# Patient Record
Sex: Male | Born: 1980 | Race: Black or African American | Hispanic: No | Marital: Single | Smoking: Never smoker
Health system: Southern US, Community
[De-identification: ages and names within clinical notes are randomized; demographics above are authoritative.]

## PROBLEM LIST (undated history)

## (undated) DIAGNOSIS — W3400XA Accidental discharge from unspecified firearms or gun, initial encounter: Secondary | ICD-10-CM

## (undated) HISTORY — PX: CHEST TUBE INSERTION: SHX231

---

## 2013-11-01 ENCOUNTER — Emergency Department (INDEPENDENT_AMBULATORY_CARE_PROVIDER_SITE_OTHER)
Admission: EM | Admit: 2013-11-01 | Discharge: 2013-11-01 | Disposition: A | Discharge status: Home / Self Care | Payer: Self-pay | Source: Home / Self Care | Attending: Emergency Medicine | Admitting: Emergency Medicine

## 2013-11-01 ENCOUNTER — Encounter (HOSPITAL_COMMUNITY): Payer: Self-pay | Admitting: Emergency Medicine

## 2013-11-01 DIAGNOSIS — S139XXA Sprain of joints and ligaments of unspecified parts of neck, initial encounter: Secondary | ICD-10-CM

## 2013-11-01 DIAGNOSIS — S335XXA Sprain of ligaments of lumbar spine, initial encounter: Secondary | ICD-10-CM

## 2013-11-01 DIAGNOSIS — Y92410 Unspecified street and highway as the place of occurrence of the external cause: Secondary | ICD-10-CM

## 2013-11-01 DIAGNOSIS — S161XXA Strain of muscle, fascia and tendon at neck level, initial encounter: Secondary | ICD-10-CM

## 2013-11-01 DIAGNOSIS — R0789 Other chest pain: Secondary | ICD-10-CM

## 2013-11-01 DIAGNOSIS — S39012A Strain of muscle, fascia and tendon of lower back, initial encounter: Secondary | ICD-10-CM

## 2013-11-01 MED ORDER — HYDROCODONE-ACETAMINOPHEN 5-325 MG PO TABS: ORAL_TABLET | ORAL | Status: DC

## 2013-11-01 MED ORDER — MELOXICAM 15 MG PO TABS: 15.0000 mg | ORAL_TABLET | Freq: Every day | ORAL | Status: DC

## 2013-11-01 MED ORDER — CYCLOBENZAPRINE HCL 5 MG PO TABS: 5.0000 mg | ORAL_TABLET | Freq: Three times a day (TID) | ORAL | Status: DC | PRN
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
## 2013-11-01 NOTE — Discharge Instructions (Signed)
Do exercises twice daily followed by moist heat for 15 minutes. ° ° ° ° ° °Try to be as active as possible. ° °If no better in 2 weeks, follow up with orthopedist. ° ° °TREATMENT  °Treatment initially involves the use of ice and medication to help reduce pain and inflammation. It is also important to perform strengthening and stretching exercises and modify activities that worsen symptoms so the injury does not get worse. These exercises may be performed at home or with a therapist. For patients who experience severe symptoms, a soft padded collar may be recommended to be worn around the neck.  °Improving your posture may help reduce symptoms. Posture improvement includes pulling your chin and abdomen in while sitting or standing. If you are sitting, sit in a firm chair with your buttocks against the back of the chair. While sleeping, try replacing your pillow with a small towel rolled to 2 inches in diameter, or use a cervical pillow. Poor sleeping positions delay healing.  ° °MEDICATION  °· If pain medication is necessary, nonsteroidal anti-inflammatory medications, such as aspirin and ibuprofen, or other minor pain relievers, such as acetaminophen, are often recommended. °· Do not take pain medication for 7 days before surgery. °· Prescription pain relievers may be given if deemed necessary by your caregiver. Use only as directed and only as much as you need. ° °HEAT AND COLD:  °· Cold treatment (icing) relieves pain and reduces inflammation. Cold treatment should be applied for 10 to 15 minutes every 2 to 3 hours for inflammation and pain and immediately after any activity that aggravates your symptoms. Use ice packs or an ice massage. °· Heat treatment may be used prior to performing the stretching and strengthening activities prescribed by your caregiver, physical therapist, or athletic trainer. Use a heat pack or a warm soak. ° °SEEK MEDICAL CARE IF:  °· Symptoms get worse or do not improve in 2 weeks despite  treatment. °· New, unexplained symptoms develop (drugs used in treatment may produce side effects). ° °EXERCISES °RANGE OF MOTION (ROM) AND STRETCHING EXERCISES - Cervical Strain and Sprain °These exercises may help you when beginning to rehabilitate your injury. In order to successfully resolve your symptoms, you must improve your posture. These exercises are designed to help reduce the forward-head and rounded-shoulder posture which contributes to this condition. Your symptoms may resolve with or without further involvement from your physician, physical therapist or athletic trainer. While completing these exercises, remember:  °· Restoring tissue flexibility helps normal motion to return to the joints. This allows healthier, less painful movement and activity. °· An effective stretch should be held for at least 20 seconds, although you may need to begin with shorter hold times for comfort. °· A stretch should never be painful. You should only feel a gentle lengthening or release in the stretched tissue. ° °STRETCH- Axial Extensors °· Lie on your back on the floor. You may bend your knees for comfort. Place a rolled up hand towel or dish towel, about 2 inches in diameter, under the part of your head that makes contact with the floor. °· Gently tuck your chin, as if trying to make a "double chin," until you feel a gentle stretch at the base of your head. °· Hold _____10_____ seconds. °Repeat _____10_____ times. Complete this exercise _____2_____ times per day.  ° °STRETECH - Axial Extension  °· Stand or sit on a firm surface. Assume a good posture: chest up, shoulders drawn back, abdominal muscles slightly   tense, knees unlocked (if standing) and feet hip width apart. °· Slowly retract your chin so your head slides back and your chin slightly lowers.Continue to look straight ahead. °· You should feel a gentle stretch in the back of your head. Be certain not to feel an aggressive stretch since this can cause  headaches later. °· Hold for ____10______ seconds. °Repeat _____10_____ times. Complete this exercise ____2______ times per day. ° °STRETCH  Cervical Side Bend  °· Stand or sit on a firm surface. Assume a good posture: chest up, shoulders drawn back, abdominal muscles slightly tense, knees unlocked (if standing) and feet hip width apart. °· Without letting your nose or shoulders move, slowly tip your right / left ear to your shoulder until your feel a gentle stretch in the muscles on the opposite side of your neck. °· Hold _____10_____ seconds. °Repeat _____10_____ times. Complete this exercise _____2_____ times per day. ° °STRETCH  Cervical Rotators  °· Stand or sit on a firm surface. Assume a good posture: chest up, shoulders drawn back, abdominal muscles slightly tense, knees unlocked (if standing) and feet hip width apart. °· Keeping your eyes level with the ground, slowly turn your head until you feel a gentle stretch along the back and opposite side of your neck. °· Hold _____10_____ seconds. °Repeat ____10______ times. Complete this exercise ____2______ times per day. ° °RANGE OF MOTION - Neck Circles  °· Stand or sit on a firm surface. Assume a good posture: chest up, shoulders drawn back, abdominal muscles slightly tense, knees unlocked (if standing) and feet hip width apart. °· Gently roll your head down and around from the back of one shoulder to the back of the other. The motion should never be forced or painful. °· Repeat the motion 10-20 times, or until you feel the neck muscles relax and loosen. °Repeat ____10______ times. Complete the exercise _____2_____ times per day. ° °STRENGTHENING EXERCISES - Cervical Strain and Sprain °These exercises may help you when beginning to rehabilitate your injury. They may resolve your symptoms with or without further involvement from your physician, physical therapist or athletic trainer. While completing these exercises, remember:  °· Muscles can gain both the  endurance and the strength needed for everyday activities through controlled exercises. °· Complete these exercises as instructed by your physician, physical therapist or athletic trainer. Progress the resistance and repetitions only as guided. °· You may experience muscle soreness or fatigue, but the pain or discomfort you are trying to eliminate should never worsen during these exercises. If this pain does worsen, stop and make certain you are following the directions exactly. If the pain is still present after adjustments, discontinue the exercise until you can discuss the trouble with your clinician. ° °STRENGTH Cervical Flexors, Isometric °· Face a wall, standing about 6 inches away. Place a small pillow, a ball about 6-8 inches in diameter, or a folded towel between your forehead and the wall. °· Slightly tuck your chin and gently push your forehead into the soft object. Push only with mild to moderate intensity, building up tension gradually. Keep your jaw and forehead relaxed. °· Hold 10 to 20 seconds. Keep your breathing relaxed. °· Release the tension slowly. Relax your neck muscles completely before you start the next repetition. °Repeat _____10_____ times. Complete this exercise _____2_____ times per day. ° °STRENGTH- Cervical Lateral Flexors, Isometric  °· Stand about 6 inches away from a wall. Place a small pillow, a ball about 6-8 inches in diameter, or a folded   towel between the side of your head and the wall. °· Slightly tuck your chin and gently tilt your head into the soft object. Push only with mild to moderate intensity, building up tension gradually. Keep your jaw and forehead relaxed. °· Hold 10 to 20 seconds. Keep your breathing relaxed. °· Release the tension slowly. Relax your neck muscles completely before you start the next repetition. °Repeat _____10_____ times. Complete this exercise ____2______ times per day. ° °STRENGTH  Cervical Extensors, Isometric  °· Stand about 6 inches away from  a wall. Place a small pillow, a ball about 6-8 inches in diameter, or a folded towel between the back of your head and the wall. °· Slightly tuck your chin and gently tilt your head back into the soft object. Push only with mild to moderate intensity, building up tension gradually. Keep your jaw and forehead relaxed. °· Hold 10 to 20 seconds. Keep your breathing relaxed. °· Release the tension slowly. Relax your neck muscles completely before you start the next repetition. °Repeat _____10_____ times. Complete this exercise _____2_____ times per day. ° °POSTURE AND BODY MECHANICS CONSIDERATIONS - Cervical Strain and Sprain °Keeping correct posture when sitting, standing or completing your activities will reduce the stress put on different body tissues, allowing injured tissues a chance to heal and limiting painful experiences. The following are general guidelines for improved posture. Your physician or physical therapist will provide you with any instructions specific to your needs. While reading these guidelines, remember: °· The exercises prescribed by your provider will help you have the flexibility and strength to maintain correct postures. °· The correct posture provides the optimal environment for your joints to work. All of your joints have less wear and tear when properly supported by a spine with good posture. This means you will experience a healthier, less painful body. °· Correct posture must be practiced with all of your activities, especially prolonged sitting and standing. Correct posture is as important when doing repetitive low-stress activities (typing) as it is when doing a single heavy-load activity (lifting). °PROLONGED STANDING WHILE SLIGHTLY LEANING FORWARD °When completing a task that requires you to lean forward while standing in one place for a long time, place either foot up on a stationary 2-4 inch high object to help maintain the best posture. When both feet are on the ground, the low  back tends to lose its slight inward curve. If this curve flattens (or becomes too large), then the back and your other joints will experience too much stress, fatigue more quickly and can cause pain.  °RESTING POSITIONS °Consider which positions are most painful for you when choosing a resting position. If you have pain with flexion-based activities (sitting, bending, stooping, squatting), choose a position that allows you to rest in a less flexed posture. You would want to avoid curling into a fetal position on your side. If your pain worsens with extension-based activities (prolonged standing, working overhead), avoid resting in an extended position such as sleeping on your stomach. Most people will find more comfort when they rest with their spine in a more neutral position, neither too rounded nor too arched. Lying on a non-sagging bed on your side with a pillow between your knees, or on your back with a pillow under your knees will often provide some relief. Keep in mind, being in any one position for a prolonged period of time, no matter how correct your posture, can still lead to stiffness. °WALKING °Walk with an upright posture. Your ears,   shoulders and hips should all line-up. °OFFICE WORK °When working at a desk, create an environment that supports good, upright posture. Without extra support, muscles fatigue and lead to excessive strain on joints and other tissues. °CHAIR: °· A chair should be able to slide under your desk when your back makes contact with the back of the chair. This allows you to work closely. °· The chair's height should allow your eyes to be level with the upper part of your monitor and your hands to be slightly lower than your elbows. °· Body position: °· Your feet should make contact with the floor. If this is not possible, use a foot rest. °· Keep your ears over your shoulders. This will reduce stress on your neck and low back. °Document Released: 07/06/2005 Document Revised:  09/28/2011 Document Reviewed: 10/18/2008 °ExitCare® Patient Information ©2013 [employer]. ° ° °

## 2014-03-04 ENCOUNTER — Encounter (HOSPITAL_COMMUNITY): Payer: Self-pay | Admitting: Emergency Medicine

## 2014-03-04 ENCOUNTER — Emergency Department (HOSPITAL_COMMUNITY): Payer: Self-pay

## 2014-03-04 ENCOUNTER — Emergency Department (HOSPITAL_COMMUNITY)
Admission: EM | Admit: 2014-03-04 | Discharge: 2014-03-04 | Disposition: A | Discharge status: Home / Self Care | Payer: Self-pay | Attending: Emergency Medicine | Admitting: Emergency Medicine

## 2014-03-04 DIAGNOSIS — Y990 Civilian activity done for income or pay: Secondary | ICD-10-CM | POA: Insufficient documentation

## 2014-03-04 DIAGNOSIS — S61317A Laceration without foreign body of left little finger with damage to nail, initial encounter: Secondary | ICD-10-CM

## 2014-03-04 DIAGNOSIS — Y9389 Activity, other specified: Secondary | ICD-10-CM | POA: Insufficient documentation

## 2014-03-04 DIAGNOSIS — Y9289 Other specified places as the place of occurrence of the external cause: Secondary | ICD-10-CM | POA: Insufficient documentation

## 2014-03-04 DIAGNOSIS — S61209A Unspecified open wound of unspecified finger without damage to nail, initial encounter: Secondary | ICD-10-CM | POA: Insufficient documentation

## 2014-03-04 DIAGNOSIS — W3189XA Contact with other specified machinery, initial encounter: Secondary | ICD-10-CM | POA: Insufficient documentation

## 2014-03-04 HISTORY — DX: Accidental discharge from unspecified firearms or gun, initial encounter: W34.00XA

## 2014-03-04 MED ORDER — OXYCODONE-ACETAMINOPHEN 5-325 MG PO TABS
1.0000 | ORAL_TABLET | Freq: Once | ORAL | Status: AC
  Administered 2014-03-04: 1 via ORAL
  Filled 2014-03-04: qty 1

## 2014-03-04 MED ORDER — OXYCODONE-ACETAMINOPHEN 5-325 MG PO TABS: 1.0000 | ORAL_TABLET | Freq: Four times a day (QID) | ORAL | Status: DC | PRN

## 2014-03-04 NOTE — ED Notes (Addendum)
Pt reports cut left pinky finger at work with a meat cutter about 2 hours PTA. Pt reports able to control bleeding at time of incident. Pt when pt got home bleeding began again. Area avulsed with partial nail missing.

## 2014-03-04 NOTE — Discharge Instructions (Signed)
Please follow up with your primary care physician in 1-2 days. If you do not have one please call the [facility] number listed above.  Please take pain medication and/or muscle relaxants as prescribed and as needed for pain. Please do not drive on narcotic pain medication or on muscle relaxants. Please keep the area clean, dry, and covered. Please read all discharge instructions and return precautions.    Finger Avulsion  When the tip of the finger is lost, a new nail may grow back if part of the fingernail is left. The new nail may be deformed. If just the tip of the finger is lost, no repair may be needed unless there is bone showing. If bone is showing, your caregiver may need to remove the protruding bone and put on a bandage. Your caregiver will do what is best for you. Most of the time when a fingertip is lost, the end will gradually grow back on and look fairly normal, but it may remain sensitive to pressure and temperature extremes for a long time. HOME CARE INSTRUCTIONS   Keep your hand elevated above your heart to relieve pain and swelling.  Keep your dressing dry and clean.  Change your bandage in 24 hours or as directed.  Only take over-the-counter or prescription medicines for pain, discomfort, or fever as directed by your caregiver.  See your caregiver as needed for problems. SEEK MEDICAL CARE IF:   You have increased pain, swelling, drainage, or bleeding.  You have a fever.  You have swelling that spreads from your finger and into your hand. Make sure to check to see if you need a tetanus booster. Document Released: 09/14/2001 Document Revised: 01/05/2012 Document Reviewed: 08/09/2008 [facility] Patient Information 2015 [place] [place]. This information is not intended to replace advice given to you by your health care provider. Make sure you discuss any questions you have with your health care provider. Laceration Care, Adult A laceration is a cut or  lesion that goes through all layers of the skin and into the tissue just beneath the skin. TREATMENT  Some lacerations may not require closure. Some lacerations may not be able to be closed due to an increased risk of infection. It is important to see your caregiver as soon as possible after an injury to minimize the risk of infection and maximize the opportunity for successful closure. If closure is appropriate, pain medicines may be given, if needed. The wound will be cleaned to help prevent infection. Your caregiver will use stitches (sutures), staples, wound glue (adhesive), or skin adhesive strips to repair the laceration. These tools bring the skin edges together to allow for faster healing and a better cosmetic outcome. However, all wounds will heal with a scar. Once the wound has healed, scarring can be minimized by covering the wound with sunscreen during the day for 1 full year. HOME CARE INSTRUCTIONS  For sutures or staples:  Keep the wound clean and dry.  If you were given a bandage (dressing), you should change it at least once a day. Also, change the dressing if it becomes wet or dirty, or as directed by your caregiver.  Wash the wound with soap and water 2 times a day. Rinse the wound off with water to remove all soap. Pat the wound dry with a clean towel.  After cleaning, apply a thin layer of the antibiotic ointment as recommended by your caregiver. This will help prevent infection and keep the dressing from sticking.  You may shower as usual after the first 24 hours. Do not soak the wound in water until the sutures are removed.  Only take over-the-counter or prescription medicines for pain, discomfort, or fever as directed by your caregiver.  Get your sutures or staples removed as directed by your caregiver. For skin adhesive strips:  Keep the wound clean and dry.  Do not get the skin adhesive strips wet. You may bathe carefully, using caution to keep the wound dry.  If  the wound gets wet, pat it dry with a clean towel.  Skin adhesive strips will fall off on their own. You may trim the strips as the wound heals. Do not remove skin adhesive strips that are still stuck to the wound. They will fall off in time. For wound adhesive:  You may briefly wet your wound in the shower or bath. Do not soak or scrub the wound. Do not swim. Avoid periods of heavy perspiration until the skin adhesive has fallen off on its own. After showering or bathing, gently pat the wound dry with a clean towel.  Do not apply liquid medicine, cream medicine, or ointment medicine to your wound while the skin adhesive is in place. This may loosen the film before your wound is healed.  If a dressing is placed over the wound, be careful not to apply tape directly over the skin adhesive. This may cause the adhesive to be pulled off before the wound is healed.  Avoid prolonged exposure to sunlight or tanning lamps while the skin adhesive is in place. Exposure to ultraviolet light in the first year will darken the scar.  The skin adhesive will usually remain in place for 5 to 10 days, then naturally fall off the skin. Do not pick at the adhesive film. You may need a tetanus shot if:  You cannot remember when you had your last tetanus shot.  You have never had a tetanus shot. If you get a tetanus shot, your arm may swell, get red, and feel warm to the touch. This is common and not a problem. If you need a tetanus shot and you choose not to have one, there is a rare chance of getting tetanus. Sickness from tetanus can be serious. SEEK MEDICAL CARE IF:   You have redness, swelling, or increasing pain in the wound.  You see a red line that goes away from the wound.  You have yellowish-white fluid (pus) coming from the wound.  You have a fever.  You notice a bad smell coming from the wound or dressing.  Your wound breaks open before or after sutures have been removed.  You notice something  coming out of the wound such as wood or glass.  Your wound is on your hand or foot and you cannot move a finger or toe. SEEK IMMEDIATE MEDICAL CARE IF:   Your pain is not controlled with prescribed medicine.  You have severe swelling around the wound causing pain and numbness or a change in color in your arm, hand, leg, or foot.  Your wound splits open and starts bleeding.  You have worsening numbness, weakness, or loss of function of any joint around or beyond the wound.  You develop painful lumps near the wound or on the skin anywhere on your body. MAKE SURE YOU:   Understand these instructions.  Will watch your condition.  Will get help right away if you are not doing well or get worse. Document Released: 07/06/2005 Document Revised: 09/28/2011 Document  Reviewed: 12/30/2010 Exit[employer] Patient Information 2015 [place], [place]. This information is not intended to replace advice given to you by your health care provider. Make sure you discuss any questions you have with your health care provider.

## 2014-03-04 NOTE — ED Notes (Signed)
Patient returned from X-ray 

## 2014-03-04 NOTE — ED Notes (Signed)
PA at bedside applying dermabond

## 2014-03-04 NOTE — ED Notes (Signed)
Patient transported to X-ray 

## 2014-03-04 NOTE — ED Notes (Signed)
Soaking finger in cleaning solution.
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
## 2014-03-07 NOTE — ED Provider Notes (Signed)
Medical screening examination/treatment/procedure(s) were performed by non-physician practitioner and as supervising physician I was immediately available for consultation/collaboration.  Chike Farrington T Tanner Yeley, MD 03/07/14 1000 

## 2014-09-08 ENCOUNTER — Encounter (HOSPITAL_COMMUNITY): Payer: Self-pay | Admitting: *Deleted

## 2014-09-08 ENCOUNTER — Emergency Department (INDEPENDENT_AMBULATORY_CARE_PROVIDER_SITE_OTHER)
Admission: EM | Admit: 2014-09-08 | Discharge: 2014-09-08 | Disposition: A | Discharge status: Home / Self Care | Payer: Self-pay | Source: Home / Self Care | Attending: Emergency Medicine | Admitting: Emergency Medicine

## 2014-09-08 DIAGNOSIS — J111 Influenza due to unidentified influenza virus with other respiratory manifestations: Secondary | ICD-10-CM

## 2014-09-08 MED ORDER — KETOROLAC TROMETHAMINE 60 MG/2ML IM SOLN
INTRAMUSCULAR | Status: AC
  Filled 2014-09-08: qty 2

## 2014-09-08 MED ORDER — GUAIFENESIN-CODEINE 100-10 MG/5ML PO SYRP: 5.0000 mL | ORAL_SOLUTION | ORAL | Status: DC | PRN

## 2014-09-08 MED ORDER — OSELTAMIVIR PHOSPHATE 75 MG PO CAPS: ORAL_CAPSULE | ORAL | Status: DC

## 2014-09-08 MED ORDER — KETOROLAC TROMETHAMINE 60 MG/2ML IM SOLN
60.0000 mg | Freq: Once | INTRAMUSCULAR | Status: AC
  Administered 2014-09-08: 60 mg via INTRAMUSCULAR
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
## 2014-09-08 NOTE — Discharge Instructions (Signed)
Influenza May take Theraflu along with prescription meds. Ibuprofen 600 mg every 6 hours as needed. Lots of fluids. Influenza ("the flu") is a viral infection of the respiratory tract. It occurs more often in winter months because people spend more time in close contact with one another. Influenza can make you feel very sick. Influenza easily spreads from person to person (contagious). CAUSES  Influenza is caused by a virus that infects the respiratory tract. You can catch the virus by breathing in droplets from an infected person's cough or sneeze. You can also catch the virus by touching something that was recently contaminated with the virus and then touching your mouth, nose, or eyes. RISKS AND COMPLICATIONS You may be at risk for a more severe case of influenza if you smoke cigarettes, have diabetes, have chronic heart disease (such as heart failure) or lung disease (such as asthma), or if you have a weakened immune system. Elderly people and pregnant women are also at risk for more serious infections. The most common problem of influenza is a lung infection (pneumonia). Sometimes, this problem can require emergency medical care and may be life threatening. SIGNS AND SYMPTOMS  Symptoms typically last 4 to 10 days and may include:  Fever.  Chills.  Headache, body aches, and muscle aches.  Sore throat.  Chest discomfort and cough.  Poor appetite.  Weakness or feeling tired.  Dizziness.  Nausea or vomiting. DIAGNOSIS  Diagnosis of influenza is often made based on your history and a physical exam. A nose or throat swab test can be done to confirm the diagnosis. TREATMENT  In mild cases, influenza goes away on its own. Treatment is directed at relieving symptoms. For more severe cases, your health care provider may prescribe antiviral medicines to shorten the sickness. Antibiotic medicines are not effective because the infection is caused by a virus, not by bacteria. HOME CARE  INSTRUCTIONS  Take medicines only as directed by your health care provider.  Use a cool mist humidifier to make breathing easier.  Get plenty of rest until your temperature returns to normal. This usually takes 3 to 4 days.  Drink enough fluid to keep your urine clear or pale yellow.  Cover yourmouth and nosewhen coughing or sneezing,and wash your handswellto prevent thevirusfrom spreading.  Stay homefromwork orschool untilthe fever is gonefor at least 681full day. PREVENTION  An annual influenza vaccination (flu shot) is the best way to avoid getting influenza. An annual flu shot is now routinely recommended for all adults in the [place] SEEK MEDICAL CARE IF:  You experiencechest pain, yourcough worsens,or you producemore mucus.  Youhave nausea,vomiting, ordiarrhea.  Your fever returns or gets worse. SEEK IMMEDIATE MEDICAL CARE IF:  You havetrouble breathing, you become short of breath,or your skin ornails becomebluish.  You have severe painor stiffnessin the neck.  You develop a sudden headache, or pain in the face or ear.  You have nausea or vomiting that you cannot control. MAKE SURE YOU:   Understand these instructions.  Will watch your condition.  Will get help right away if you are not doing well or get worse. Document Released: 07/03/2000 Document Revised: 11/20/2013 Document Reviewed: 10/05/2011 [facility] Patient Information 2015 [place], [place]. This information is not intended to replace advice given to you by your health care provider. Make sure you discuss any questions you have with your health care provider.

## 2015-01-23 IMAGING — CR DG FINGER LITTLE 2+V*L*
3 series · 3 of 3 positions shown · non-contrast
Comparison: None

CLINICAL DATA: Laceration and pain at distal LEFT little finger,
cut finger with knife at work

EXAM:
LEFT LITTLE FINGER 2+V

[x finger obl. left]
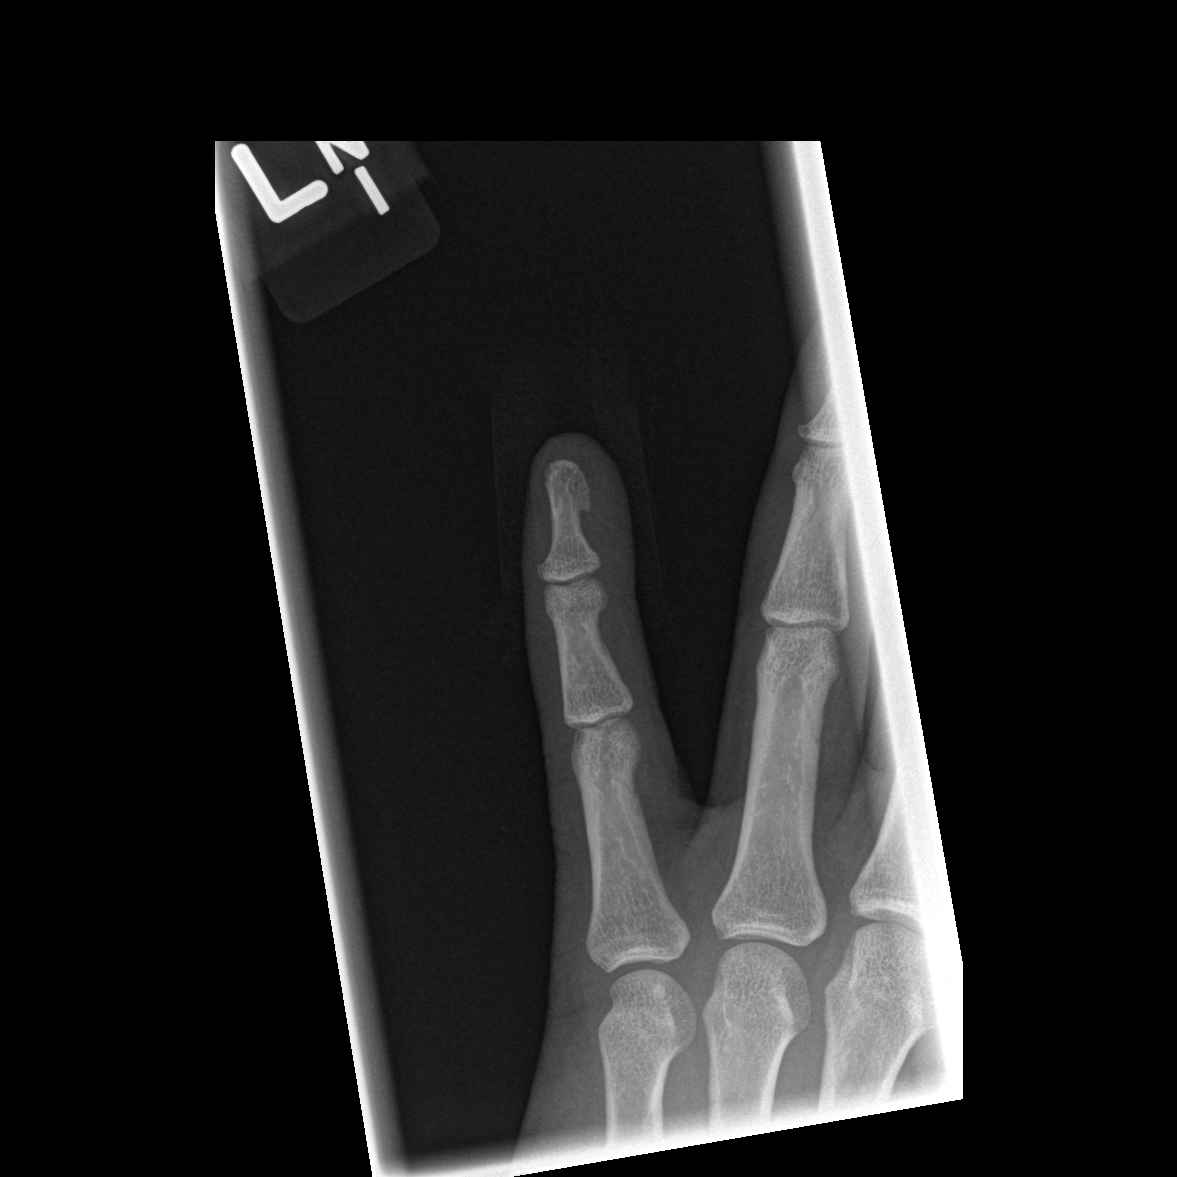

[x finger lateral left]
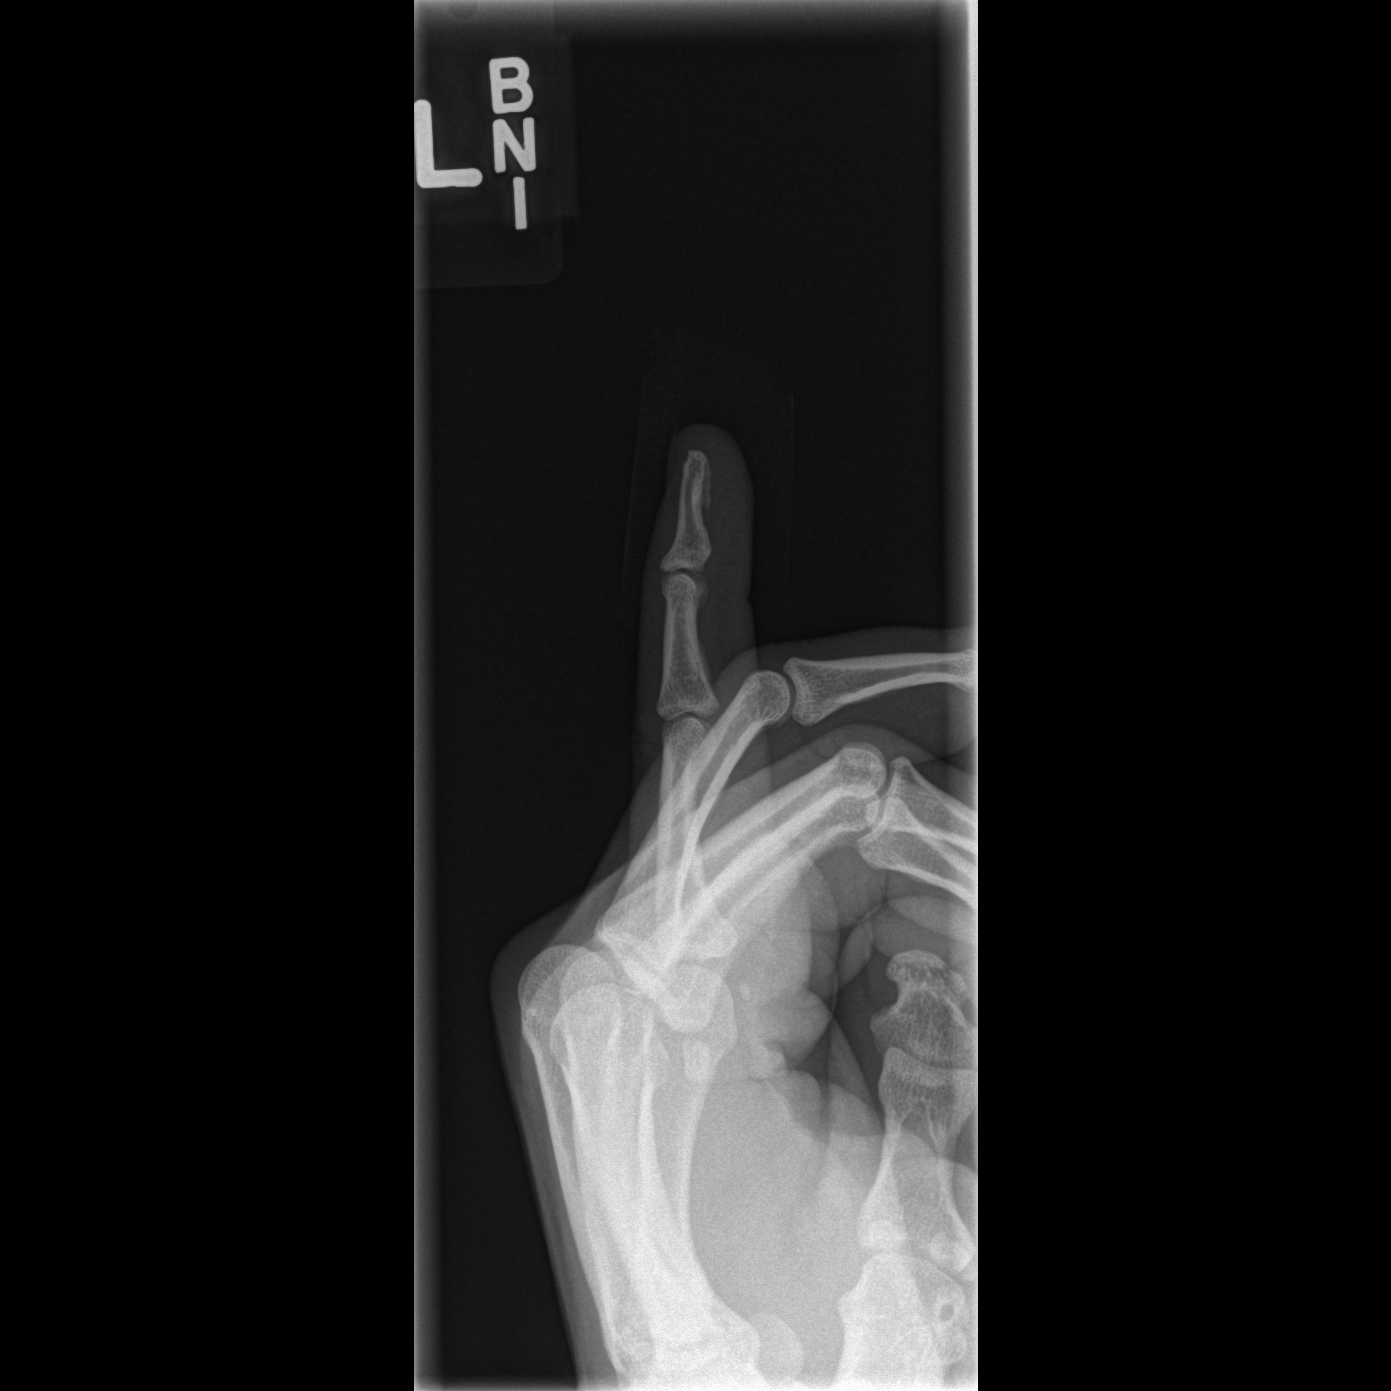

[x finger pa left]
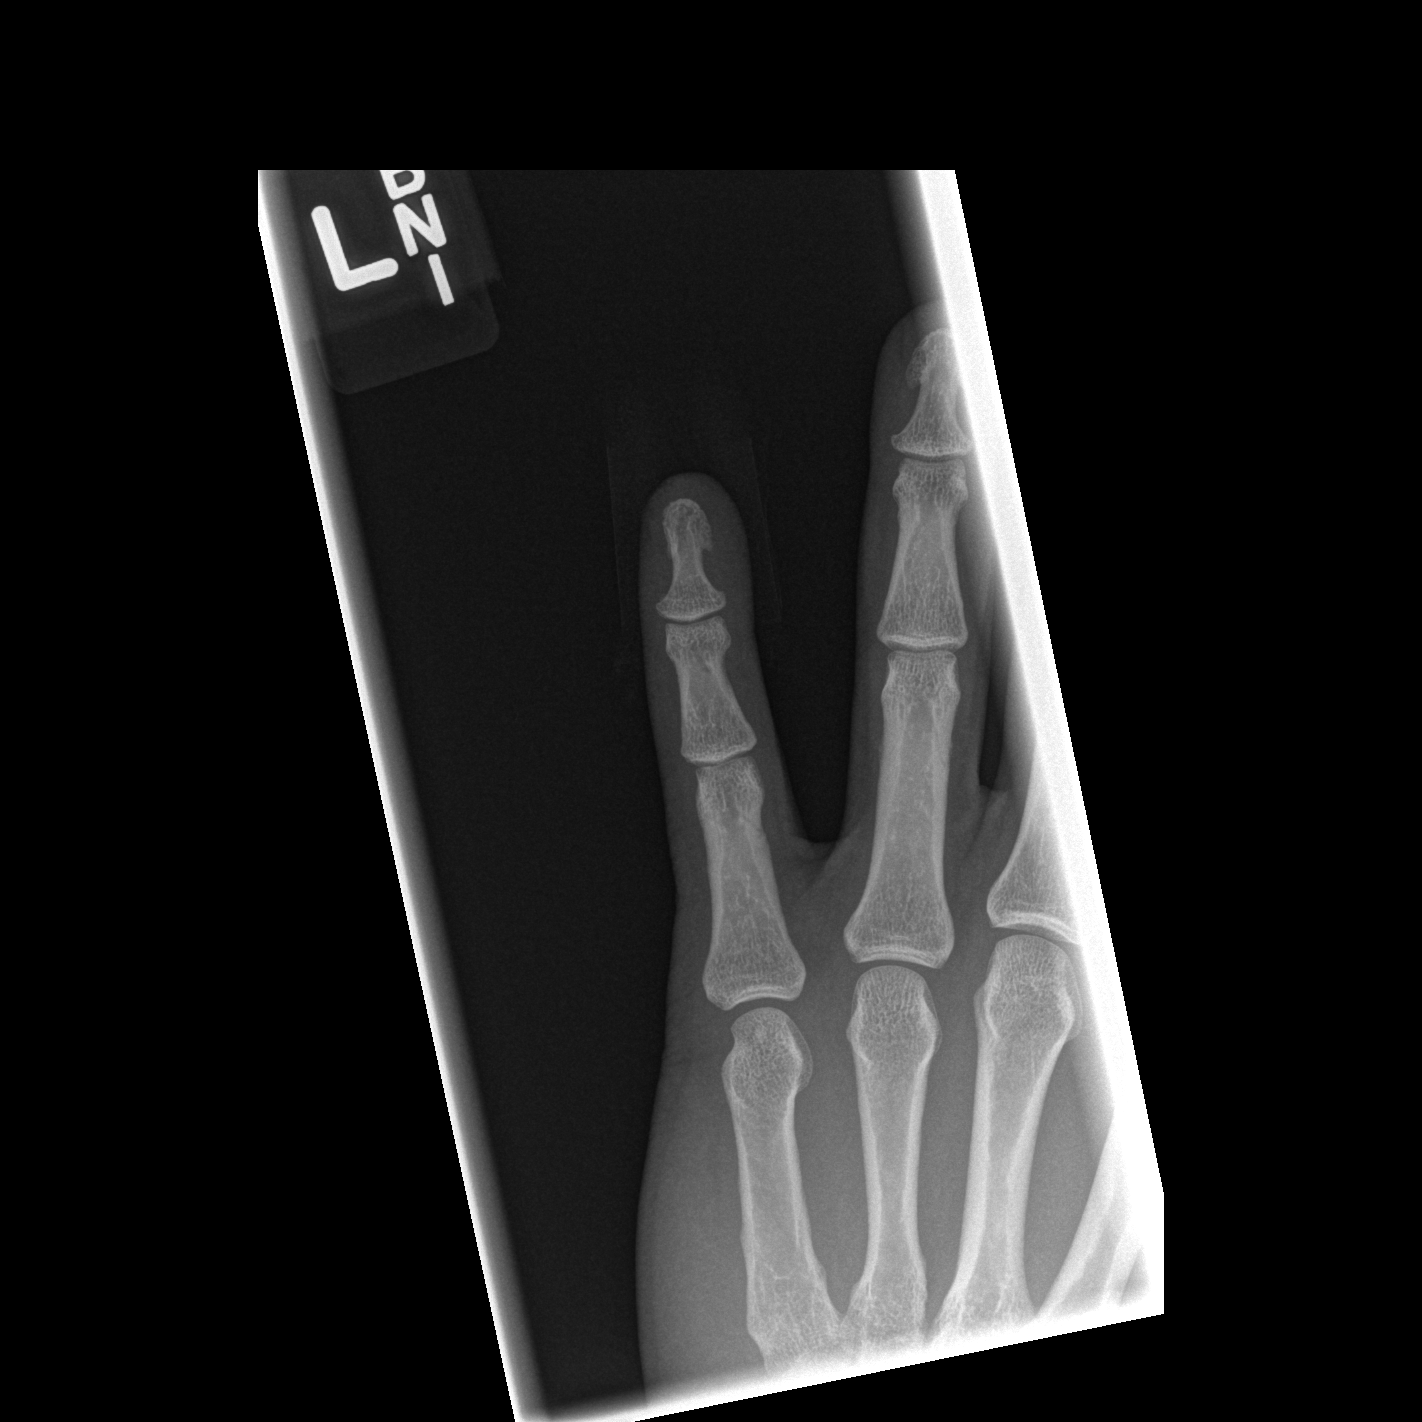

[3 of 3 positions shown; findings below may reference images not displayed]

FINDINGS: Dressing artifacts at distal phalanx.

Osseous mineralization normal.

Joint spaces preserved.

No acute fracture, dislocation or bone destruction.
IMPRESSION: No acute osseous abnormalities.

## 2016-12-29 ENCOUNTER — Encounter (HOSPITAL_COMMUNITY): Payer: Self-pay | Admitting: *Deleted

## 2016-12-29 ENCOUNTER — Ambulatory Visit (HOSPITAL_COMMUNITY)
Admission: EM | Admit: 2016-12-29 | Discharge: 2016-12-29 | Disposition: A | Discharge status: Home / Self Care | Payer: Self-pay | Attending: Family Medicine | Admitting: Family Medicine

## 2016-12-29 DIAGNOSIS — R3915 Urgency of urination: Secondary | ICD-10-CM | POA: Insufficient documentation

## 2016-12-29 DIAGNOSIS — R1030 Lower abdominal pain, unspecified: Secondary | ICD-10-CM

## 2016-12-29 DIAGNOSIS — R102 Pelvic and perineal pain: Secondary | ICD-10-CM

## 2016-12-29 DIAGNOSIS — Z79899 Other long term (current) drug therapy: Secondary | ICD-10-CM | POA: Insufficient documentation

## 2016-12-29 DIAGNOSIS — R350 Frequency of micturition: Secondary | ICD-10-CM

## 2016-12-29 DIAGNOSIS — R109 Unspecified abdominal pain: Secondary | ICD-10-CM | POA: Insufficient documentation

## 2016-12-29 LAB — POCT URINALYSIS DIP (DEVICE)
BILIRUBIN URINE: NEGATIVE
Glucose, UA: NEGATIVE mg/dL
HGB URINE DIPSTICK: NEGATIVE
Ketones, ur: NEGATIVE mg/dL
Leukocytes, UA: NEGATIVE
Nitrite: NEGATIVE
PROTEIN: NEGATIVE mg/dL
Specific Gravity, Urine: 1.02 (ref 1.005–1.030)
Urobilinogen, UA: 1 mg/dL (ref 0.0–1.0)
pH: 6 (ref 5.0–8.0)

## 2016-12-29 MED ORDER — AZITHROMYCIN 250 MG PO TABS
ORAL_TABLET | ORAL | Status: AC
  Filled 2016-12-29: qty 4

## 2016-12-29 MED ORDER — STERILE WATER FOR INJECTION IJ SOLN
INTRAMUSCULAR | Status: AC
  Filled 2016-12-29: qty 10

## 2016-12-29 MED ORDER — CEFTRIAXONE SODIUM 250 MG IJ SOLR
INTRAMUSCULAR | Status: AC
  Filled 2016-12-29: qty 250

## 2016-12-29 MED ORDER — PHENAZOPYRIDINE HCL 200 MG PO TABS: 200.0000 mg | ORAL_TABLET | Freq: Three times a day (TID) | ORAL | 0 refills | Status: DC

## 2016-12-29 MED ORDER — AZITHROMYCIN 250 MG PO TABS
1000.0000 mg | ORAL_TABLET | Freq: Once | ORAL | Status: AC
  Administered 2016-12-29: 1000 mg via ORAL

## 2016-12-29 MED ORDER — CEFTRIAXONE SODIUM 250 MG IJ SOLR
250.0000 mg | Freq: Once | INTRAMUSCULAR | Status: AC
  Administered 2016-12-29: 250 mg via INTRAMUSCULAR
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
## 2016-12-29 NOTE — ED Triage Notes (Signed)
Pt  Also  Reports      abd   Pain        And       Frequent   Urination        No  Pain          Pt  Reports   abd   Pain       Denies   Any   Diarrhea  Or  Constipation

## 2016-12-30 LAB — URINE CYTOLOGY ANCILLARY ONLY
Chlamydia: NEGATIVE
Neisseria Gonorrhea: NEGATIVE
Trichomonas: NEGATIVE

## 2016-12-30 LAB — URINE CULTURE: Culture: NO GROWTH

## 2018-12-12 ENCOUNTER — Encounter (HOSPITAL_COMMUNITY): Payer: Self-pay

## 2018-12-12 ENCOUNTER — Telehealth (HOSPITAL_COMMUNITY): Payer: Self-pay | Admitting: Emergency Medicine

## 2018-12-12 ENCOUNTER — Other Ambulatory Visit: Payer: Self-pay

## 2018-12-12 ENCOUNTER — Ambulatory Visit (HOSPITAL_COMMUNITY)
Admission: EM | Admit: 2018-12-12 | Discharge: 2018-12-12 | Disposition: A | Discharge status: Home / Self Care | Payer: Self-pay | Attending: Family Medicine | Admitting: Family Medicine

## 2018-12-12 DIAGNOSIS — M545 Low back pain, unspecified: Secondary | ICD-10-CM

## 2018-12-12 LAB — POCT URINALYSIS DIP (DEVICE)
Bilirubin Urine: NEGATIVE
Glucose, UA: NEGATIVE mg/dL
Hgb urine dipstick: NEGATIVE
Ketones, ur: NEGATIVE mg/dL
Leukocytes,Ua: NEGATIVE
Nitrite: NEGATIVE
Protein, ur: NEGATIVE mg/dL
Specific Gravity, Urine: 1.01 (ref 1.005–1.030)
Urobilinogen, UA: 0.2 mg/dL (ref 0.0–1.0)
pH: 7 (ref 5.0–8.0)

## 2018-12-12 MED ORDER — METHOCARBAMOL 500 MG PO TABS: 500.0000 mg | ORAL_TABLET | Freq: Two times a day (BID) | ORAL | 0 refills | Status: DC

## 2018-12-12 MED ORDER — IBUPROFEN 800 MG PO TABS: 800.0000 mg | ORAL_TABLET | Freq: Three times a day (TID) | ORAL | 0 refills | Status: DC

## 2018-12-12 NOTE — Discharge Instructions (Signed)
Return if any problems.
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
## 2018-12-12 NOTE — ED Triage Notes (Signed)
Pt presents with right side flank pain for past 3 days.

## 2020-10-14 ENCOUNTER — Ambulatory Visit (HOSPITAL_COMMUNITY)
Admission: EM | Admit: 2020-10-14 | Discharge: 2020-10-14 | Disposition: A | Discharge status: Home / Self Care | Payer: Self-pay | Attending: Internal Medicine | Admitting: Internal Medicine

## 2020-10-14 ENCOUNTER — Other Ambulatory Visit: Payer: Self-pay

## 2020-10-14 ENCOUNTER — Encounter (HOSPITAL_COMMUNITY): Payer: Self-pay

## 2020-10-14 DIAGNOSIS — R350 Frequency of micturition: Secondary | ICD-10-CM | POA: Insufficient documentation

## 2020-10-14 LAB — CBC WITH DIFFERENTIAL/PLATELET
Abs Immature Granulocytes: 0.01 10*3/uL (ref 0.00–0.07)
Basophils Absolute: 0 10*3/uL (ref 0.0–0.1)
Basophils Relative: 1 %
Eosinophils Absolute: 0.2 10*3/uL (ref 0.0–0.5)
Eosinophils Relative: 3 %
HCT: 44.9 % (ref 39.0–52.0)
Hemoglobin: 14.8 g/dL (ref 13.0–17.0)
Immature Granulocytes: 0 %
Lymphocytes Relative: 45 %
Lymphs Abs: 2.7 10*3/uL (ref 0.7–4.0)
MCH: 32.2 pg (ref 26.0–34.0)
MCHC: 33 g/dL (ref 30.0–36.0)
MCV: 97.6 fL (ref 80.0–100.0)
Monocytes Absolute: 0.5 10*3/uL (ref 0.1–1.0)
Monocytes Relative: 9 %
Neutro Abs: 2.5 10*3/uL (ref 1.7–7.7)
Neutrophils Relative %: 42 %
Platelets: 302 10*3/uL (ref 150–400)
RBC: 4.6 MIL/uL (ref 4.22–5.81)
RDW: 11.9 % (ref 11.5–15.5)
WBC: 6 10*3/uL (ref 4.0–10.5)
nRBC: 0 % (ref 0.0–0.2)

## 2020-10-14 LAB — BASIC METABOLIC PANEL
Anion gap: 7 (ref 5–15)
BUN: 11 mg/dL (ref 6–20)
CO2: 28 mmol/L (ref 22–32)
Calcium: 9.7 mg/dL (ref 8.9–10.3)
Chloride: 103 mmol/L (ref 98–111)
Creatinine, Ser: 1.15 mg/dL (ref 0.61–1.24)
GFR, Estimated: >60 mL/min (ref >60)
Glucose, Bld: 89 mg/dL (ref 70–99)
Potassium: 3.7 mmol/L (ref 3.5–5.1)
Sodium: 138 mmol/L (ref 135–145)

## 2020-10-14 LAB — POCT URINALYSIS DIPSTICK, ED / UC
Bilirubin Urine: NEGATIVE
Glucose, UA: NEGATIVE mg/dL
Hgb urine dipstick: NEGATIVE
Ketones, ur: NEGATIVE mg/dL
Leukocytes,Ua: NEGATIVE
Nitrite: NEGATIVE
Protein, ur: NEGATIVE mg/dL
Specific Gravity, Urine: 1.025 (ref 1.005–1.030)
Urobilinogen, UA: 4 mg/dL — ABNORMAL HIGH (ref 0.0–1.0)
pH: 7 (ref 5.0–8.0)

## 2020-10-14 LAB — CBG MONITORING, ED: Glucose-Capillary: 82 mg/dL (ref 70–99)

## 2020-10-14 NOTE — ED Triage Notes (Signed)
Pt in with c/o urinary frequency and and lower abdominal pain that has been going on for 5 days now
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
## 2020-12-24 ENCOUNTER — Other Ambulatory Visit: Payer: Self-pay

## 2020-12-24 ENCOUNTER — Encounter (HOSPITAL_COMMUNITY): Payer: Self-pay | Admitting: Emergency Medicine

## 2020-12-24 ENCOUNTER — Ambulatory Visit (HOSPITAL_COMMUNITY)
Admission: EM | Admit: 2020-12-24 | Discharge: 2020-12-24 | Disposition: A | Discharge status: Home / Self Care | Payer: Self-pay | Attending: Emergency Medicine | Admitting: Emergency Medicine

## 2020-12-24 DIAGNOSIS — K141 Geographic tongue: Secondary | ICD-10-CM | POA: Insufficient documentation

## 2020-12-24 LAB — POCT RAPID STREP A, ED / UC: Streptococcus, Group A Screen (Direct): NEGATIVE

## 2020-12-24 MED ORDER — NYSTATIN 100000 UNIT/ML MT SUSP: 500000.0000 [IU] | Freq: Four times a day (QID) | OROMUCOSAL | 0 refills | Status: DC

## 2020-12-24 NOTE — Discharge Instructions (Addendum)
Can use nystatin suspension 4 times a day until area clears  If persistent after 1 week please follow up for reevaluation   Please brush teeth and tongue twice daily and can use mouthwash to clean further   Can use salt water gargles, hot liquids and throat lozenges to help with voice

## 2020-12-24 NOTE — ED Triage Notes (Signed)
Started having a loss of voice on Saturday.  Voice has gradually returned, yellow coating to tongue.    Reports covid tests negative
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
## 2020-12-27 LAB — CULTURE, GROUP A STREP (THRC)

## 2021-08-25 ENCOUNTER — Other Ambulatory Visit: Payer: Self-pay

## 2021-08-25 ENCOUNTER — Encounter: Payer: Self-pay | Admitting: Family Medicine

## 2021-08-25 ENCOUNTER — Ambulatory Visit (INDEPENDENT_AMBULATORY_CARE_PROVIDER_SITE_OTHER): Payer: 59 | Admitting: Family Medicine

## 2021-08-25 VITALS — BP 124/76 | HR 100 | Temp 98.2°F | Ht 69.0 in | Wt 212.0 lb

## 2021-08-25 DIAGNOSIS — Z114 Encounter for screening for human immunodeficiency virus [HIV]: Secondary | ICD-10-CM

## 2021-08-25 DIAGNOSIS — R0683 Snoring: Secondary | ICD-10-CM

## 2021-08-25 DIAGNOSIS — Z0000 Encounter for general adult medical examination without abnormal findings: Secondary | ICD-10-CM | POA: Diagnosis not present

## 2021-08-25 DIAGNOSIS — Z1159 Encounter for screening for other viral diseases: Secondary | ICD-10-CM

## 2021-08-25 DIAGNOSIS — R350 Frequency of micturition: Secondary | ICD-10-CM

## 2021-08-25 DIAGNOSIS — N401 Enlarged prostate with lower urinary tract symptoms: Secondary | ICD-10-CM

## 2021-08-25 DIAGNOSIS — M545 Low back pain, unspecified: Secondary | ICD-10-CM

## 2021-08-25 DIAGNOSIS — N3943 Post-void dribbling: Secondary | ICD-10-CM

## 2021-08-25 LAB — COMPREHENSIVE METABOLIC PANEL
ALT: 20 U/L (ref 0–53)
AST: 21 U/L (ref 0–37)
Albumin: 4.7 g/dL (ref 3.5–5.2)
Alkaline Phosphatase: 35 U/L — ABNORMAL LOW (ref 39–117)
BUN: 11 mg/dL (ref 6–23)
CO2: 31 mEq/L (ref 19–32)
Calcium: 10.1 mg/dL (ref 8.4–10.5)
Chloride: 102 mEq/L (ref 96–112)
Creatinine, Ser: 1.16 mg/dL (ref 0.40–1.50)
GFR: 79.99 mL/min (ref >60.00)
Glucose, Bld: 95 mg/dL (ref 70–99)
Potassium: 4.1 mEq/L (ref 3.5–5.1)
Sodium: 139 mEq/L (ref 135–145)
Total Bilirubin: 1 mg/dL (ref 0.2–1.2)
Total Protein: 7.9 g/dL (ref 6.0–8.3)

## 2021-08-25 LAB — CBC
HCT: 44.5 % (ref 39.0–52.0)
Hemoglobin: 14.3 g/dL (ref 13.0–17.0)
MCHC: 32.2 g/dL (ref 30.0–36.0)
MCV: 96.9 fl (ref 78.0–100.0)
Platelets: 326 10*3/uL (ref 150.0–400.0)
RBC: 4.59 Mil/uL (ref 4.22–5.81)
RDW: 12.6 % (ref 11.5–15.5)
WBC: 6.8 10*3/uL (ref 4.0–10.5)

## 2021-08-25 LAB — LIPID PANEL
Cholesterol: 197 mg/dL (ref 0–200)
HDL: 36.4 mg/dL — ABNORMAL LOW (ref >39.00)
LDL Cholesterol: 135 mg/dL — ABNORMAL HIGH (ref 0–99)
NonHDL: 160.43
Total CHOL/HDL Ratio: 5
Triglycerides: 128 mg/dL (ref 0.0–149.0)
VLDL: 25.6 mg/dL (ref 0.0–40.0)

## 2021-08-25 LAB — PSA: PSA: 0.95 ng/mL (ref 0.10–4.00)

## 2021-08-25 MED ORDER — TAMSULOSIN HCL 0.4 MG PO CAPS: 0.4000 mg | ORAL_CAPSULE | Freq: Every day | ORAL | 3 refills | Status: DC
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
## 2021-08-26 LAB — URINALYSIS, ROUTINE W REFLEX MICROSCOPIC
Bilirubin Urine: NEGATIVE
Hgb urine dipstick: NEGATIVE
Ketones, ur: NEGATIVE
Leukocytes,Ua: NEGATIVE
Nitrite: NEGATIVE
RBC / HPF: NONE SEEN (ref 0–?)
Specific Gravity, Urine: 1.015 (ref 1.000–1.030)
Total Protein, Urine: NEGATIVE
Urine Glucose: NEGATIVE
Urobilinogen, UA: 1 (ref 0.0–1.0)
pH: 8 (ref 5.0–8.0)

## 2021-08-26 LAB — HIV ANTIBODY (ROUTINE TESTING W REFLEX): HIV 1&2 Ab, 4th Generation: NONREACTIVE

## 2021-08-26 LAB — HEPATITIS C ANTIBODY
Hepatitis C Ab: NONREACTIVE
SIGNAL TO CUT-OFF: 0.11 (ref <1.00)

## 2021-11-24 ENCOUNTER — Encounter: Payer: Self-pay | Admitting: Family Medicine

## 2021-11-24 ENCOUNTER — Ambulatory Visit: Payer: 59 | Admitting: Family Medicine

## 2021-11-24 VITALS — BP 128/78 | HR 89 | Temp 98.4°F | Ht 69.0 in | Wt 210.0 lb

## 2021-11-24 DIAGNOSIS — N401 Enlarged prostate with lower urinary tract symptoms: Secondary | ICD-10-CM | POA: Diagnosis not present

## 2021-11-24 DIAGNOSIS — N3943 Post-void dribbling: Secondary | ICD-10-CM

## 2021-11-24 DIAGNOSIS — M545 Low back pain, unspecified: Secondary | ICD-10-CM | POA: Diagnosis not present

## 2021-11-24 MED ORDER — TAMSULOSIN HCL 0.4 MG PO CAPS: 0.4000 mg | ORAL_CAPSULE | Freq: Every day | ORAL | 3 refills | Status: AC

## 2021-11-24 MED ORDER — METHOCARBAMOL 500 MG PO TABS: 500.0000 mg | ORAL_TABLET | Freq: Three times a day (TID) | ORAL | 0 refills | Status: DC | PRN

## 2021-11-24 MED ORDER — IBUPROFEN 800 MG PO TABS: 800.0000 mg | ORAL_TABLET | Freq: Three times a day (TID) | ORAL | 0 refills | Status: DC | PRN

## 2021-11-24 NOTE — Progress Notes (Addendum)
? ?Established Patient Office Visit ? ?Subjective   ?Patient ID: Joseph Travis, male    DOB: 1981-06-27  Age: 41 y.o. MRN: [id number] ? ?Chief Complaint  ?Patient presents with  ? Follow-up  ?  3 month follow up, would like a refill on muscle relaxer.   ? ? ?HPI follow-up of BPH and lumbago.  Tamsulosin seems to be helping his urine flow.  Continues to take it he tells me.  Back tends to bother him from time to time.  Pain is in his lower back which can be on either side.  Primarily on the right.  Occasionally experiences pain in the right posterior thigh.  Denies change in bowel or bladder function saddle paresthesias or weakness in his lower extremities.  Occasional Robaxin seems to help.  He uses ibuprofen on occasion as well.  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  Did not go for sleep study. ? ? ? ?Review of Systems  ?Constitutional: Negative.   ?HENT: Negative.    ?Eyes:  Negative for blurred vision, discharge and redness.  ?Respiratory: Negative.    ?Cardiovascular: Negative.   ?Gastrointestinal:  Negative for abdominal pain.  ?Genitourinary: Negative.  Negative for frequency and urgency.  ?Musculoskeletal:  Positive for back pain and myalgias.  ?Skin:  Negative for rash.  ?Neurological:  Negative for tingling, loss of consciousness and weakness.  ?Endo/Heme/Allergies:  Negative for polydipsia.  ? ?  ?Objective:  ?  ? ?BP 128/78 (BP Location: Right Arm, Patient Position: Sitting, Cuff Size: Large)   Pulse 89   Temp 98.4 ?F (36.9 ?C) (Temporal)   Ht 5\' 9"  (1.753 m)   Wt 210 lb (95.3 kg)   SpO2 97%   BMI 31.01 kg/m?  ? ? ?Physical Exam ?Constitutional:   ?   General: He is not in acute distress. ?   Appearance: Normal appearance. He is not ill-appearing, toxic-appearing or diaphoretic.  ?HENT:  ?   Head: Normocephalic and atraumatic.  ?   Right Ear: External ear normal.  ?   Left Ear: External ear normal.  ?   Mouth/Throat:  ?   Mouth: Mucous membranes  are moist.  ?   Pharynx: Oropharynx is clear. No oropharyngeal exudate or posterior oropharyngeal erythema.  ?Eyes:  ?   General: No scleral icterus.    ?   Right eye: No discharge.     ?   Left eye: No discharge.  ?   Extraocular Movements: Extraocular movements intact.  ?   Conjunctiva/sclera: Conjunctivae normal.  ?   Pupils: Pupils are equal, round, and reactive to light.  ?Cardiovascular:  ?   Rate and Rhythm: Normal rate and regular rhythm.  ?Pulmonary:  ?   Effort: Pulmonary effort is normal. No respiratory distress.  ?   Breath sounds: Normal breath sounds.  ?Abdominal:  ?   General: Bowel sounds are normal.  ?   Tenderness: There is no abdominal tenderness. There is no guarding.  ?Musculoskeletal:  ?   Cervical back: No rigidity or tenderness.  ?   Lumbar back: No tenderness or bony tenderness. Normal range of motion. Negative right straight leg raise test and negative left straight leg raise test.  ?   Right hip: No tenderness. Normal range of motion. Normal strength.  ?   Left hip: No tenderness. Normal range of motion. Normal strength.  ?Skin: ?   General: Skin is warm and dry.  ?Neurological:  ?   Mental  Status: He is alert and oriented to person, place, and time.  ?Psychiatric:     ?   Mood and Affect: Mood normal.     ?   Behavior: Behavior normal.  ? ? ? ?No results found for any visits on 11/24/21. ? ? ? ?The ASCVD Risk score (Arnett DK, et al., 2019) failed to calculate for the following reasons: ?  The 2019 ASCVD risk score is only valid for ages 44 to 38 ? ?  ?Assessment & Plan:  ? ?Problem List Items Addressed This Visit   ? ?  ? Genitourinary  ? Benign prostatic hyperplasia with post-void dribbling  ? Relevant Medications  ? tamsulosin (FLOMAX) 0.4 MG CAPS capsule  ?  ? Other  ? Left low back pain - Primary  ? Relevant Medications  ? methocarbamol (ROBAXIN) 500 MG tablet  ? ibuprofen (ADVIL) 800 MG tablet  ? ? ?Return in about 6 months (around 05/27/2022), or if symptoms worsen or fail to  improve.  ?Continue tamsulosin daily.  We will use Robaxin and ibuprofen as needed and sparingly.  Rarely uses these medications. ? ?Mliss Sax, MD ? ?

## 2023-03-12 ENCOUNTER — Telehealth: Payer: Self-pay | Admitting: Family Medicine

## 2023-03-12 ENCOUNTER — Encounter: Payer: Self-pay | Admitting: Family Medicine

## 2023-03-12 NOTE — Telephone Encounter (Signed)
Pt sent mychart msg 1 hr prior to appt requesting to cancel/reschedule  1st missed visit
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
## 2023-03-18 ENCOUNTER — Ambulatory Visit (INDEPENDENT_AMBULATORY_CARE_PROVIDER_SITE_OTHER): Payer: BC Managed Care – PPO | Admitting: Family Medicine

## 2023-03-18 ENCOUNTER — Other Ambulatory Visit (HOSPITAL_COMMUNITY)
Admission: RE | Admit: 2023-03-18 | Discharge: 2023-03-18 | Disposition: A | Discharge status: Home / Self Care | Payer: BC Managed Care – PPO | Source: Ambulatory Visit | Attending: Family Medicine | Admitting: Family Medicine

## 2023-03-18 ENCOUNTER — Encounter: Payer: Self-pay | Admitting: Family Medicine

## 2023-03-18 VITALS — BP 116/78 | HR 84 | Temp 98.3°F | Ht 69.0 in | Wt 201.0 lb

## 2023-03-18 DIAGNOSIS — R350 Frequency of micturition: Secondary | ICD-10-CM

## 2023-03-18 DIAGNOSIS — Z113 Encounter for screening for infections with a predominantly sexual mode of transmission: Secondary | ICD-10-CM | POA: Insufficient documentation

## 2023-03-18 DIAGNOSIS — Z13220 Encounter for screening for lipoid disorders: Secondary | ICD-10-CM | POA: Diagnosis not present

## 2023-03-18 DIAGNOSIS — Z0000 Encounter for general adult medical examination without abnormal findings: Secondary | ICD-10-CM | POA: Diagnosis not present

## 2023-03-18 LAB — COMPREHENSIVE METABOLIC PANEL
ALT: 10 U/L (ref 0–53)
AST: 14 U/L (ref 0–37)
Albumin: 4.2 g/dL (ref 3.5–5.2)
Alkaline Phosphatase: 34 U/L — ABNORMAL LOW (ref 39–117)
BUN: 10 mg/dL (ref 6–23)
CO2: 28 mEq/L (ref 19–32)
Calcium: 9.5 mg/dL (ref 8.4–10.5)
Chloride: 104 mEq/L (ref 96–112)
Creatinine, Ser: 1.14 mg/dL (ref 0.40–1.50)
GFR: 80.79 mL/min (ref >60.00)
Glucose, Bld: 80 mg/dL (ref 70–99)
Potassium: 4 mEq/L (ref 3.5–5.1)
Sodium: 138 mEq/L (ref 135–145)
Total Bilirubin: 1.7 mg/dL — ABNORMAL HIGH (ref 0.2–1.2)
Total Protein: 6.9 g/dL (ref 6.0–8.3)

## 2023-03-18 LAB — LIPID PANEL
Cholesterol: 140 mg/dL (ref 0–200)
HDL: 28.9 mg/dL — ABNORMAL LOW (ref >39.00)
LDL Cholesterol: 95 mg/dL (ref 0–99)
NonHDL: 111.39
Total CHOL/HDL Ratio: 5
Triglycerides: 81 mg/dL (ref 0.0–149.0)
VLDL: 16.2 mg/dL (ref 0.0–40.0)

## 2023-03-18 LAB — URINALYSIS, ROUTINE W REFLEX MICROSCOPIC
Bilirubin Urine: NEGATIVE
Hgb urine dipstick: NEGATIVE
Ketones, ur: NEGATIVE
Leukocytes,Ua: NEGATIVE
Nitrite: NEGATIVE
RBC / HPF: NONE SEEN (ref 0–?)
Specific Gravity, Urine: 1.015 (ref 1.000–1.030)
Total Protein, Urine: NEGATIVE
Urine Glucose: NEGATIVE
Urobilinogen, UA: 1 (ref 0.0–1.0)
pH: 7 (ref 5.0–8.0)

## 2023-03-18 LAB — CBC
HCT: 41.8 % (ref 39.0–52.0)
Hemoglobin: 13.4 g/dL (ref 13.0–17.0)
MCHC: 32.1 g/dL (ref 30.0–36.0)
MCV: 98.5 fl (ref 78.0–100.0)
Platelets: 319 10*3/uL (ref 150.0–400.0)
RBC: 4.25 Mil/uL (ref 4.22–5.81)
RDW: 12.6 % (ref 11.5–15.5)
WBC: 5.1 10*3/uL (ref 4.0–10.5)

## 2023-03-18 NOTE — Progress Notes (Signed)
Established Patient Office Visit   Subjective:  Patient ID: Joseph Travis, male    DOB: 21-Dec-1980  Age: 42 y.o. MRN: [id number]  Chief Complaint  Patient presents with   Annual Exam    CPE. Pt is not fasting. Pt complains of l groin pain and urinary frequency x 6 months.     HPI Encounter Diagnoses  Name Primary?   Healthcare maintenance Yes   Urinary frequency    Screen for STD (sexually transmitted disease)    Screening for cholesterol level    Physical exam and follow-up of urinary frequency.  Frequency is intermittent.  There is no discharge or dysuria.  He lives with his significant other and children.  He is not active sexually outside of their relationship.   Review of Systems  Constitutional: Negative.   HENT: Negative.    Eyes:  Negative for blurred vision, discharge and redness.  Respiratory: Negative.    Cardiovascular: Negative.   Gastrointestinal:  Negative for abdominal pain.  Genitourinary:  Positive for frequency. Negative for dysuria, flank pain, hematuria and urgency.  Musculoskeletal: Negative.  Negative for myalgias.  Skin:  Negative for rash.  Neurological:  Negative for tingling, loss of consciousness and weakness.  Endo/Heme/Allergies:  Negative for polydipsia.     Current Outpatient Medications:    ibuprofen (ADVIL) 800 MG tablet, Take 1 tablet (800 mg total) by mouth every 8 (eight) hours as needed., Disp: 21 tablet, Rfl: 0   methocarbamol (ROBAXIN) 500 MG tablet, Take 1 tablet (500 mg total) by mouth every 8 (eight) hours as needed for muscle spasms. (Patient not taking: Reported on 03/18/2023), Disp: 30 tablet, Rfl: 0   tamsulosin (FLOMAX) 0.4 MG CAPS capsule, Take 1 capsule (0.4 mg total) by mouth daily. (Patient not taking: Reported on 03/18/2023), Disp: 90 capsule, Rfl: 3   Objective:     BP 116/78   Pulse 84   Temp 98.3 F (36.8 C)   Ht 5\' 9"  (1.753 m)   Wt 201 lb (91.2 kg)   SpO2 97%   BMI 29.68 kg/m    Physical  Exam Constitutional:      General: He is not in acute distress.    Appearance: Normal appearance. He is not ill-appearing, toxic-appearing or diaphoretic.  HENT:     Head: Normocephalic and atraumatic.     Right Ear: External ear normal.     Left Ear: External ear normal.     Mouth/Throat:     Mouth: Mucous membranes are moist.     Pharynx: Oropharynx is clear. No oropharyngeal exudate or posterior oropharyngeal erythema.  Eyes:     General: No scleral icterus.       Right eye: No discharge.        Left eye: No discharge.     Extraocular Movements: Extraocular movements intact.     Conjunctiva/sclera: Conjunctivae normal.     Pupils: Pupils are equal, round, and reactive to light.  Cardiovascular:     Rate and Rhythm: Normal rate and regular rhythm.  Pulmonary:     Effort: Pulmonary effort is normal. No respiratory distress.     Breath sounds: Normal breath sounds.  Abdominal:     General: Bowel sounds are normal. There is no distension.     Tenderness: There is no abdominal tenderness. There is no right CVA tenderness, left CVA tenderness, guarding or rebound.     Hernia: No hernia is present. There is no hernia in the left inguinal area or right inguinal area.  Genitourinary:    Penis: Circumcised. No hypospadias, erythema, tenderness, discharge, swelling or lesions.      Testes:        Right: Mass, tenderness or swelling not present. Right testis is descended.        Left: Mass, tenderness or swelling not present. Left testis is descended.     Epididymis:     Right: Not inflamed or enlarged.     Left: Not inflamed or enlarged.  Musculoskeletal:     Cervical back: No rigidity or tenderness.  Lymphadenopathy:     Lower Body: No right inguinal adenopathy. No left inguinal adenopathy.  Skin:    General: Skin is warm and dry.  Neurological:     Mental Status: He is alert and oriented to person, place, and time.  Psychiatric:        Mood and Affect: Mood normal.         Behavior: Behavior normal.      No results found for any visits on 03/18/23.    The ASCVD Risk score (Arnett DK, et al., 2019) failed to calculate for the following reasons:   The 2019 ASCVD risk score is only valid for ages 50 to 24    Assessment & Plan:   Healthcare maintenance -     CBC -     Comprehensive metabolic panel  Urinary frequency -     Urinalysis, Routine w reflex microscopic -     Urine cytology ancillary only  Screen for STD (sexually transmitted disease) -     Urine cytology ancillary only  Screening for cholesterol level -     Lipid panel    Return in about 1 year (around 03/17/2024), or if symptoms worsen or fail to improve.  Information was given on health maintenance and disease prevention as well as urinary frequency.  Mliss Sax, MD

## 2023-03-25 ENCOUNTER — Other Ambulatory Visit: Payer: Self-pay | Admitting: Family Medicine

## 2023-03-25 DIAGNOSIS — M545 Low back pain, unspecified: Secondary | ICD-10-CM

## 2023-03-25 LAB — URINE CYTOLOGY ANCILLARY ONLY
Chlamydia: NEGATIVE
Comment: NEGATIVE
Comment: NORMAL
Neisseria Gonorrhea: NEGATIVE

## 2023-03-25 MED ORDER — METHOCARBAMOL 500 MG PO TABS: 500.0000 mg | ORAL_TABLET | Freq: Three times a day (TID) | ORAL | 0 refills | Status: AC | PRN

## 2023-03-25 MED ORDER — IBUPROFEN 800 MG PO TABS: 800.0000 mg | ORAL_TABLET | Freq: Three times a day (TID) | ORAL | 0 refills | Status: AC | PRN

## 2023-05-10 ENCOUNTER — Encounter: Payer: Self-pay | Admitting: Family Medicine

## 2023-12-06 ENCOUNTER — Ambulatory Visit: Payer: Self-pay

## 2023-12-06 NOTE — Telephone Encounter (Signed)
 Copied from CRM [id number]. Topic: Clinical - Red Word Triage >> Dec 06, 2023 10:42 AM Joseph Travis wrote: [facility] that prompted transfer to Nurse Triage: Lower back pain and around waist on left side   Chief Complaint: Back pain Symptoms: Left lower back pain that radiates to his left lower abdomen  Frequency: Constant  Pertinent Negatives: Patient denies any urinary symptoms  Disposition: [] ED /[] Urgent Care (no appt availability in office) / [x] Appointment(In office/virtual)/ []  [facility]/ [] Home Care/ [] Refused Recommended Disposition /[] [facility]/ []  Follow-up with PCP Additional Notes: Patient calling to report lower back pain that has been going on for the last 1-2 weeks. He states his pain is moderate and is now constant. He states his pain is in his left lower back and radiates to his left lower abdomen. He denies any urinary symptoms, numbness, or weakness. Patient has appointment scheduled for tomorrow for evaluation.     Reason for Disposition  [1] MODERATE back pain (e.g., interferes with normal activities) AND [2] present > 3 days  Answer Assessment - Initial Assessment Questions 1. ONSET: "When did the pain begin?"      A year, worse over the last 1-2 weeks 2. LOCATION: "Where does it hurt?" (upper, mid or lower back)     Left sided lower back pain  3. SEVERITY: "How bad is the pain?"  (e.g., Scale 1-10; mild, moderate, or severe)   - MILD (1-3): Doesn't interfere with normal activities.    - MODERATE (4-7): Interferes with normal activities or awakens from sleep.    - SEVERE (8-10): Excruciating pain, unable to do any normal activities.      Moderate  4. PATTERN: "Is the pain constant?" (e.g., yes, no; constant, intermittent)      Constant  5. RADIATION: "Does the pain shoot into your legs or somewhere else?"     Radiates to left lower abdomen  6. CAUSE:  "What do you think is causing the back pain?"      Unsure  7. BACK OVERUSE:  "Any recent  lifting of heavy objects, strenuous work or exercise?"     No 8. MEDICINES: "What have you taken so far for the pain?" (e.g., nothing, acetaminophen , NSAIDS)     No 9. NEUROLOGIC SYMPTOMS: "Do you have any weakness, numbness, or problems with bowel/bladder control?"     No 10. OTHER SYMPTOMS: "Do you have any other symptoms?" (e.g., fever, abdomen pain, burning with urination, blood in urine)       Left lower abdominal pain  Protocols used: Back Pain-A-AH

## 2023-12-07 ENCOUNTER — Ambulatory Visit: Admitting: Family Medicine

## 2023-12-07 VITALS — BP 118/80 | HR 90 | Temp 98.0°F | Ht 69.0 in | Wt 207.8 lb

## 2023-12-07 DIAGNOSIS — R1032 Left lower quadrant pain: Secondary | ICD-10-CM | POA: Diagnosis not present

## 2023-12-07 NOTE — Progress Notes (Signed)
 Established Patient Office Visit   Subjective:  Patient ID: Joseph Travis, male    DOB: Apr 08, 1981  Age: 43 y.o. MRN: [id number]  Chief Complaint  Patient presents with   Abdominal Pain    Pt complains of lower abdominal pain and lower back pain located on the left side of his side. Pt states pain has increased since his last visit.     Abdominal Pain Pertinent negatives include no constipation, diarrhea, hematuria, myalgias, nausea or vomiting.   Encounter Diagnoses  Name Primary?   Left lower quadrant abdominal pain Yes   For the last several months has been experiencing a pressure/discomfort/pain in his left lower abdominal area as well as his lower back.  Pain does not radiate into his legs.  There is no weakness numbness or tingling in his legs.  His bowel movements are regular without blood.  No fevers or chills.  No weight loss.  No blood in his urine.  The pain in the left lower quadrant of his abdomen seems to be contiguous with the pain in his left lower back.  He works as a Nutritional therapist.  He is able to do his job without issue.  He often has to loosen his belt because he cannot stand the pressure from the belt in the affected areas.   Review of Systems  Constitutional: Negative.   HENT: Negative.    Eyes:  Negative for blurred vision, discharge and redness.  Respiratory: Negative.    Cardiovascular: Negative.   Gastrointestinal:  Positive for abdominal pain. Negative for blood in stool, constipation, diarrhea, nausea and vomiting.  Genitourinary: Negative.  Negative for flank pain and hematuria.  Musculoskeletal:  Positive for back pain. Negative for myalgias.  Skin:  Negative for rash.  Neurological:  Negative for tingling, loss of consciousness and weakness.  Endo/Heme/Allergies:  Negative for polydipsia.     Current Outpatient Medications:    ibuprofen  (ADVIL ) 800 MG tablet, Take 1 tablet (800 mg total) by mouth every 8 (eight) hours as needed. (Patient not taking:  Reported on 12/07/2023), Disp: 21 tablet, Rfl: 0   methocarbamol  (ROBAXIN ) 500 MG tablet, Take 1 tablet (500 mg total) by mouth every 8 (eight) hours as needed for muscle spasms. (Patient not taking: Reported on 12/07/2023), Disp: 30 tablet, Rfl: 0   tamsulosin  (FLOMAX ) 0.4 MG CAPS capsule, Take 1 capsule (0.4 mg total) by mouth daily. (Patient not taking: Reported on 12/07/2023), Disp: 90 capsule, Rfl: 3   Objective:     BP 118/80 (Cuff Size: Normal)   Pulse 90   Temp 98 F (36.7 C) (Temporal)   Ht 5\' 9"  (1.753 m)   Wt 207 lb 12.8 oz (94.3 kg)   SpO2 98%   BMI 30.69 kg/m  Wt Readings from Last 3 Encounters:  12/07/23 207 lb 12.8 oz (94.3 kg)  03/18/23 201 lb (91.2 kg)  11/24/21 210 lb (95.3 kg)      Physical Exam Constitutional:      General: He is not in acute distress.    Appearance: Normal appearance. He is not ill-appearing, toxic-appearing or diaphoretic.  HENT:     Head: Normocephalic and atraumatic.     Right Ear: External ear normal.     Left Ear: External ear normal.  Eyes:     General: No scleral icterus.       Right eye: No discharge.        Left eye: No discharge.     Extraocular Movements: Extraocular movements intact.  Conjunctiva/sclera: Conjunctivae normal.  Cardiovascular:     Rate and Rhythm: Normal rate and regular rhythm.  Pulmonary:     Effort: Pulmonary effort is normal. No respiratory distress.  Abdominal:     General: Bowel sounds are normal.     Tenderness: There is no abdominal tenderness. There is no right CVA tenderness, left CVA tenderness, guarding or rebound.     Hernia: There is no hernia in the umbilical area, ventral area, left inguinal area or right inguinal area.  Genitourinary:    Penis: Normal.      Testes:        Right: Mass, tenderness or swelling not present.        Left: Mass, tenderness or swelling not present.  Musculoskeletal:     Lumbar back: No spasms, tenderness or bony tenderness. Normal range of motion. Negative  right straight leg raise test and negative left straight leg raise test.  Skin:    General: Skin is warm and dry.  Neurological:     Mental Status: He is alert and oriented to person, place, and time.     Motor: No weakness.  Psychiatric:        Mood and Affect: Mood normal.        Behavior: Behavior normal.      No results found for any visits on 12/07/23.    The 10-year ASCVD risk score (Arnett DK, et al., 2019) is: 2.8%    Assessment & Plan:   Left lower quadrant abdominal pain -     CBC with Differential/Platelet -     Comprehensive metabolic panel with GFR -     Urinalysis, Routine w reflex microscopic -     CT ABDOMEN PELVIS W CONTRAST; Future -     CT ABDOMEN PELVIS WO CONTRAST; Future    Return if symptoms worsen or fail to improve.  Checking CT with and without to rule out intra-abdominal pathology as a source of his symptoms.  Consider sports medicine referral with normal CT scan.  Tonna Frederic, MD

## 2023-12-08 LAB — CBC WITH DIFFERENTIAL/PLATELET
Basophils Absolute: 0.1 10*3/uL (ref 0.0–0.1)
Basophils Relative: 1 % (ref 0.0–3.0)
Eosinophils Absolute: 0.3 10*3/uL (ref 0.0–0.7)
Eosinophils Relative: 5.4 % — ABNORMAL HIGH (ref 0.0–5.0)
HCT: 42.8 % (ref 39.0–52.0)
Hemoglobin: 14.1 g/dL (ref 13.0–17.0)
Lymphocytes Relative: 39.1 % (ref 12.0–46.0)
Lymphs Abs: 2.5 10*3/uL (ref 0.7–4.0)
MCHC: 33 g/dL (ref 30.0–36.0)
MCV: 96.2 fl (ref 78.0–100.0)
Monocytes Absolute: 0.8 10*3/uL (ref 0.1–1.0)
Monocytes Relative: 11.8 % (ref 3.0–12.0)
Neutro Abs: 2.8 10*3/uL (ref 1.4–7.7)
Neutrophils Relative %: 42.7 % — ABNORMAL LOW (ref 43.0–77.0)
Platelets: 327 10*3/uL (ref 150.0–400.0)
RBC: 4.45 Mil/uL (ref 4.22–5.81)
RDW: 12.4 % (ref 11.5–15.5)
WBC: 6.5 10*3/uL (ref 4.0–10.5)

## 2023-12-08 LAB — URINALYSIS, ROUTINE W REFLEX MICROSCOPIC
Bilirubin Urine: NEGATIVE
Hgb urine dipstick: NEGATIVE
Ketones, ur: NEGATIVE
Leukocytes,Ua: NEGATIVE
Nitrite: NEGATIVE
RBC / HPF: NONE SEEN (ref 0–?)
Specific Gravity, Urine: 1.015 (ref 1.000–1.030)
Total Protein, Urine: NEGATIVE
Urine Glucose: NEGATIVE
Urobilinogen, UA: 0.2 (ref 0.0–1.0)
WBC, UA: NONE SEEN (ref 0–?)
pH: 7.5 (ref 5.0–8.0)

## 2023-12-08 LAB — COMPREHENSIVE METABOLIC PANEL WITH GFR
ALT: 14 U/L (ref 0–53)
AST: 17 U/L (ref 0–37)
Albumin: 4.6 g/dL (ref 3.5–5.2)
Alkaline Phosphatase: 39 U/L (ref 39–117)
BUN: 12 mg/dL (ref 6–23)
CO2: 31 meq/L (ref 19–32)
Calcium: 9.9 mg/dL (ref 8.4–10.5)
Chloride: 102 meq/L (ref 96–112)
Creatinine, Ser: 1.14 mg/dL (ref 0.40–1.50)
GFR: 80.38 mL/min (ref >60.00)
Glucose, Bld: 67 mg/dL — ABNORMAL LOW (ref 70–99)
Potassium: 4.2 meq/L (ref 3.5–5.1)
Sodium: 139 meq/L (ref 135–145)
Total Bilirubin: 0.7 mg/dL (ref 0.2–1.2)
Total Protein: 7.6 g/dL (ref 6.0–8.3)

## 2023-12-09 ENCOUNTER — Ambulatory Visit: Payer: Self-pay | Admitting: Family Medicine

## 2023-12-16 ENCOUNTER — Encounter: Payer: Self-pay | Admitting: Family Medicine

## 2023-12-20 ENCOUNTER — Ambulatory Visit
Admission: RE | Admit: 2023-12-20 | Discharge: 2023-12-20 | Disposition: A | Discharge status: Home / Self Care | Source: Ambulatory Visit | Attending: Family Medicine | Admitting: Family Medicine

## 2023-12-20 DIAGNOSIS — R1032 Left lower quadrant pain: Secondary | ICD-10-CM

## 2023-12-20 MED ORDER — IOPAMIDOL (ISOVUE-300) INJECTION 61%
100.0000 mL | Freq: Once | INTRAVENOUS | Status: AC | PRN
  Administered 2023-12-20: 100 mL via INTRAVENOUS

## 2024-02-08 ENCOUNTER — Encounter: Payer: Self-pay | Admitting: Family Medicine

## 2024-02-08 ENCOUNTER — Ambulatory Visit: Payer: Self-pay

## 2024-02-08 ENCOUNTER — Ambulatory Visit: Admitting: Family Medicine

## 2024-02-08 ENCOUNTER — Telehealth: Payer: Self-pay | Admitting: Family Medicine

## 2024-02-08 VITALS — BP 124/80 | HR 84 | Temp 98.6°F | Ht 70.0 in | Wt 208.0 lb

## 2024-02-08 DIAGNOSIS — G8929 Other chronic pain: Secondary | ICD-10-CM | POA: Diagnosis not present

## 2024-02-08 DIAGNOSIS — M545 Low back pain, unspecified: Secondary | ICD-10-CM | POA: Diagnosis not present

## 2024-02-08 DIAGNOSIS — R1032 Left lower quadrant pain: Secondary | ICD-10-CM

## 2024-02-08 MED ORDER — MELOXICAM 7.5 MG PO TABS: 7.5000 mg | ORAL_TABLET | Freq: Every day | ORAL | 0 refills | Status: AC

## 2024-02-08 NOTE — Telephone Encounter (Signed)
 error

## 2024-02-08 NOTE — Progress Notes (Signed)
 Established Patient Office Visit   Subjective:  Patient ID: Joseph Travis, male    DOB: 1981/03/16  Age: 43 y.o. MRN: [id number]  Chief Complaint  Patient presents with   Abdominal Pain    Since may left abdominal pain hurt around back  Did ct scan it was normal    Abdominal Pain Pertinent negatives include no constipation, diarrhea, dysuria, frequency, hematuria, melena, myalgias, nausea or vomiting.   Encounter Diagnoses  Name Primary?   Left lower quadrant abdominal pain Yes   Chronic left-sided low back pain without sciatica    Ongoing pain in the left lower abdominal area as well as the left back area.  Pain is nonradiating there is no numbness tingling or weakness in his lower extremities.  Denies constipation but sometimes has pain in his left lower abdominal area with stooling.  No difficulty urinating.  No incontinence of bowel bladder function.  CT scan of abdomen and pelvis was normal.  Normal CBC CMP and urinalysis.  It does not affect his ability to work or work out.   Review of Systems  Constitutional: Negative.   HENT: Negative.    Eyes:  Negative for blurred vision, discharge and redness.  Respiratory: Negative.    Cardiovascular: Negative.   Gastrointestinal:  Positive for abdominal pain. Negative for blood in stool, constipation, diarrhea, melena, nausea and vomiting.  Genitourinary: Negative.  Negative for dysuria, flank pain, frequency, hematuria and urgency.  Musculoskeletal:  Positive for back pain. Negative for myalgias.  Skin:  Negative for rash.  Neurological:  Negative for tingling, loss of consciousness and weakness.  Endo/Heme/Allergies:  Negative for polydipsia.     Current Outpatient Medications:    meloxicam  (MOBIC ) 7.5 MG tablet, Take 1 tablet (7.5 mg total) by mouth daily., Disp: 30 tablet, Rfl: 0   ibuprofen  (ADVIL ) 800 MG tablet, Take 1 tablet (800 mg total) by mouth every 8 (eight) hours as needed. (Patient not taking: Reported on  12/07/2023), Disp: 21 tablet, Rfl: 0   methocarbamol  (ROBAXIN ) 500 MG tablet, Take 1 tablet (500 mg total) by mouth every 8 (eight) hours as needed for muscle spasms. (Patient not taking: Reported on 12/07/2023), Disp: 30 tablet, Rfl: 0   tamsulosin  (FLOMAX ) 0.4 MG CAPS capsule, Take 1 capsule (0.4 mg total) by mouth daily. (Patient not taking: Reported on 12/07/2023), Disp: 90 capsule, Rfl: 3   Objective:     BP 124/80 (BP Location: Left Arm, Patient Position: Sitting)   Pulse 84   Temp 98.6 F (37 C) (Temporal)   Ht 5' 10 (1.778 m)   Wt 208 lb (94.3 kg)   SpO2 96%   BMI 29.84 kg/m    Physical Exam Constitutional:      General: He is not in acute distress.    Appearance: Normal appearance. He is not ill-appearing, toxic-appearing or diaphoretic.  HENT:     Head: Normocephalic and atraumatic.     Right Ear: External ear normal.     Left Ear: External ear normal.  Eyes:     General: No scleral icterus.       Right eye: No discharge.        Left eye: No discharge.     Extraocular Movements: Extraocular movements intact.     Conjunctiva/sclera: Conjunctivae normal.  Pulmonary:     Effort: Pulmonary effort is normal. No respiratory distress.  Abdominal:     General: Abdomen is flat. Bowel sounds are normal.     Palpations: Abdomen is soft.  Tenderness: There is no abdominal tenderness. There is no right CVA tenderness, left CVA tenderness, guarding or rebound.     Hernia: There is no hernia in the umbilical area, ventral area, left inguinal area or right inguinal area.  Genitourinary:    Penis: Circumcised. No hypospadias, erythema, tenderness, discharge, swelling or lesions.      Testes:        Right: Mass, tenderness or swelling not present. Right testis is descended.        Left: Mass, tenderness or swelling not present. Left testis is descended.     Epididymis:     Right: Not inflamed or enlarged.     Left: Not inflamed or enlarged.  Musculoskeletal:     Lumbar back:  No bony tenderness. Normal range of motion. Negative right straight leg raise test and negative left straight leg raise test.     Right hip: Normal. No tenderness. Normal range of motion.     Left hip: Normal. No tenderness. Normal range of motion.  Lymphadenopathy:     Lower Body: No right inguinal adenopathy. No left inguinal adenopathy.  Skin:    General: Skin is warm and dry.  Neurological:     Mental Status: He is alert and oriented to person, place, and time.     Motor: No weakness.     Deep Tendon Reflexes:     Reflex Scores:      Patellar reflexes are 2+ on the right side and 2+ on the left side.      Achilles reflexes are 1+ on the right side and 1+ on the left side. Psychiatric:        Mood and Affect: Mood normal.        Behavior: Behavior normal.      No results found for any visits on 02/08/24.    The 10-year ASCVD risk score (Arnett DK, et al., 2019) is: 3.1%    Assessment & Plan:   Left lower quadrant abdominal pain -     CBC with Differential/Platelet -     Comprehensive metabolic panel with GFR -     Urinalysis, Routine w reflex microscopic -     MR LUMBAR SPINE WO CONTRAST; Future -     Meloxicam ; Take 1 tablet (7.5 mg total) by mouth daily.  Dispense: 30 tablet; Refill: 0  Chronic left-sided low back pain without sciatica -     MR LUMBAR SPINE WO CONTRAST; Future -     Meloxicam ; Take 1 tablet (7.5 mg total) by mouth daily.  Dispense: 30 tablet; Refill: 0    Return in about 6 weeks (around 03/21/2024).    Elsie Sim Lent, MD

## 2024-02-08 NOTE — Telephone Encounter (Signed)
 FYI Only or Action Required?: FYI only for provider.  Patient was last seen in primary care on 12/07/2023 by Berneta Elsie Sayre, MD.  Called Nurse Triage reporting Abdominal Pain.  Symptoms began several months ago.  Interventions attempted: Nothing.  Symptoms are: unchanged.  Triage Disposition: See Physician Within 24 Hours  Patient/caregiver understands and will follow disposition?: Yes  Pt states L lower abdomen pain still ongoing and unsure what could be causing. Pain intermittent 6-7/10. Pt had CT but didn't show anything. Scheduled FU OV today at 220 with PCP.   Copied from CRM [id number]. Topic: Clinical - Red Word Triage >> Feb 08, 2024  9:01 AM Rosina BIRCH wrote: Red Word that prompted transfer to Nurse Triage: under belly on left side the patient is still having pain after he went for an MRI Reason for Disposition  [1] MODERATE pain (e.g., interferes with normal activities) AND [2] pain comes and goes (cramps) AND [3] present > 24 hours  (Exception: Pain with Vomiting or Diarrhea - see that Guideline.)  Answer Assessment - Initial Assessment Questions 1. LOCATION: Where does it hurt?      L lower abdomen  2. RADIATION: Does the pain shoot anywhere else? (e.g., chest, back)     no 3. ONSET: When did the pain begin? (Minutes, hours or days ago)      Several months  5. PATTERN Does the pain come and go, or is it constant?     Comes and goes  6. SEVERITY: How bad is the pain?  (e.g., Scale 1-10; mild, moderate, or severe)     6-7/10 9. RELIEVING/AGGRAVATING FACTORS: What makes it better or worse? (e.g., antacids, bending or twisting motion, bowel movement)     Nothing makes better, sitting or lying down have  10. OTHER SYMPTOMS: Do you have any other symptoms? (e.g., back pain, diarrhea, fever, urination pain, vomiting)       Back pain at times  Protocols used: Abdominal Pain - Male-A-AH

## 2024-02-08 NOTE — Telephone Encounter (Signed)
 Noted. Appointment scheduled for today with Dr. Berneta.

## 2024-02-09 ENCOUNTER — Other Ambulatory Visit: Payer: Self-pay | Admitting: Family Medicine

## 2024-02-09 DIAGNOSIS — M545 Low back pain, unspecified: Secondary | ICD-10-CM

## 2024-02-09 DIAGNOSIS — R1032 Left lower quadrant pain: Secondary | ICD-10-CM

## 2024-02-09 LAB — CBC WITH DIFFERENTIAL/PLATELET
Basophils Absolute: 0 K/uL (ref 0.0–0.1)
Basophils Relative: 0.9 % (ref 0.0–3.0)
Eosinophils Absolute: 0.1 K/uL (ref 0.0–0.7)
Eosinophils Relative: 2.7 % (ref 0.0–5.0)
HCT: 42.7 % (ref 39.0–52.0)
Hemoglobin: 14.1 g/dL (ref 13.0–17.0)
Lymphocytes Relative: 49.1 % — ABNORMAL HIGH (ref 12.0–46.0)
Lymphs Abs: 2.4 K/uL (ref 0.7–4.0)
MCHC: 33 g/dL (ref 30.0–36.0)
MCV: 97.2 fl (ref 78.0–100.0)
Monocytes Absolute: 0.4 K/uL (ref 0.1–1.0)
Monocytes Relative: 9.2 % (ref 3.0–12.0)
Neutro Abs: 1.8 K/uL (ref 1.4–7.7)
Neutrophils Relative %: 38.1 % — ABNORMAL LOW (ref 43.0–77.0)
Platelets: 308 K/uL (ref 150.0–400.0)
RBC: 4.4 Mil/uL (ref 4.22–5.81)
RDW: 12.6 % (ref 11.5–15.5)
WBC: 4.8 K/uL (ref 4.0–10.5)

## 2024-02-09 LAB — COMPREHENSIVE METABOLIC PANEL WITH GFR
ALT: 12 U/L (ref 0–53)
AST: 19 U/L (ref 0–37)
Albumin: 4.6 g/dL (ref 3.5–5.2)
Alkaline Phosphatase: 35 U/L — ABNORMAL LOW (ref 39–117)
BUN: 9 mg/dL (ref 6–23)
CO2: 26 meq/L (ref 19–32)
Calcium: 9.6 mg/dL (ref 8.4–10.5)
Chloride: 105 meq/L (ref 96–112)
Creatinine, Ser: 1.16 mg/dL (ref 0.40–1.50)
GFR: 78.62 mL/min (ref >60.00)
Glucose, Bld: 80 mg/dL (ref 70–99)
Potassium: 4.1 meq/L (ref 3.5–5.1)
Sodium: 139 meq/L (ref 135–145)
Total Bilirubin: 1.4 mg/dL — ABNORMAL HIGH (ref 0.2–1.2)
Total Protein: 8 g/dL (ref 6.0–8.3)

## 2024-02-09 LAB — URINALYSIS, ROUTINE W REFLEX MICROSCOPIC
Bilirubin Urine: NEGATIVE
Hgb urine dipstick: NEGATIVE
Ketones, ur: NEGATIVE
Leukocytes,Ua: NEGATIVE
Nitrite: NEGATIVE
RBC / HPF: NONE SEEN (ref 0–?)
Specific Gravity, Urine: 1.015 (ref 1.000–1.030)
Total Protein, Urine: NEGATIVE
Urine Glucose: NEGATIVE
Urobilinogen, UA: 2 — AB (ref 0.0–1.0)
pH: 7 (ref 5.0–8.0)

## 2024-02-10 ENCOUNTER — Ambulatory Visit: Payer: Self-pay | Admitting: Family Medicine

## 2024-02-21 ENCOUNTER — Ambulatory Visit
Admission: RE | Admit: 2024-02-21 | Discharge: 2024-02-21 | Disposition: A | Discharge status: Home / Self Care | Source: Ambulatory Visit | Attending: Family Medicine | Admitting: Family Medicine

## 2024-02-21 DIAGNOSIS — G8929 Other chronic pain: Secondary | ICD-10-CM

## 2024-02-21 DIAGNOSIS — R1032 Left lower quadrant pain: Secondary | ICD-10-CM

## 2024-03-21 ENCOUNTER — Encounter: Admitting: Family Medicine

## 2024-03-23 ENCOUNTER — Ambulatory Visit: Payer: Self-pay | Admitting: Family Medicine

## 2024-03-23 ENCOUNTER — Ambulatory Visit (INDEPENDENT_AMBULATORY_CARE_PROVIDER_SITE_OTHER): Admitting: Family Medicine

## 2024-03-23 ENCOUNTER — Encounter: Payer: Self-pay | Admitting: Family Medicine

## 2024-03-23 VITALS — BP 114/76 | HR 76 | Temp 97.1°F | Ht 70.0 in | Wt 208.4 lb

## 2024-03-23 DIAGNOSIS — Z0000 Encounter for general adult medical examination without abnormal findings: Secondary | ICD-10-CM

## 2024-03-23 DIAGNOSIS — Z1159 Encounter for screening for other viral diseases: Secondary | ICD-10-CM

## 2024-03-23 DIAGNOSIS — Z13220 Encounter for screening for lipoid disorders: Secondary | ICD-10-CM

## 2024-03-23 DIAGNOSIS — Z113 Encounter for screening for infections with a predominantly sexual mode of transmission: Secondary | ICD-10-CM

## 2024-03-23 DIAGNOSIS — Z131 Encounter for screening for diabetes mellitus: Secondary | ICD-10-CM

## 2024-03-23 DIAGNOSIS — Z23 Encounter for immunization: Secondary | ICD-10-CM

## 2024-03-23 LAB — COMPREHENSIVE METABOLIC PANEL WITH GFR
ALT: 15 U/L (ref 0–53)
AST: 22 U/L (ref 0–37)
Albumin: 4.5 g/dL (ref 3.5–5.2)
Alkaline Phosphatase: 33 U/L — ABNORMAL LOW (ref 39–117)
BUN: 9 mg/dL (ref 6–23)
CO2: 29 meq/L (ref 19–32)
Calcium: 9.4 mg/dL (ref 8.4–10.5)
Chloride: 103 meq/L (ref 96–112)
Creatinine, Ser: 1.22 mg/dL (ref 0.40–1.50)
GFR: 73.95 mL/min (ref >60.00)
Glucose, Bld: 83 mg/dL (ref 70–99)
Potassium: 4.3 meq/L (ref 3.5–5.1)
Sodium: 140 meq/L (ref 135–145)
Total Bilirubin: 1 mg/dL (ref 0.2–1.2)
Total Protein: 8 g/dL (ref 6.0–8.3)

## 2024-03-23 LAB — URINALYSIS, ROUTINE W REFLEX MICROSCOPIC
Bilirubin Urine: NEGATIVE
Hgb urine dipstick: NEGATIVE
Ketones, ur: NEGATIVE
Leukocytes,Ua: NEGATIVE
Nitrite: NEGATIVE
RBC / HPF: NONE SEEN (ref 0–?)
Specific Gravity, Urine: 1.025 (ref 1.000–1.030)
Total Protein, Urine: NEGATIVE
Urine Glucose: NEGATIVE
Urobilinogen, UA: 1 (ref 0.0–1.0)
pH: 6 (ref 5.0–8.0)

## 2024-03-23 LAB — CBC WITH DIFFERENTIAL/PLATELET
Basophils Absolute: 0 K/uL (ref 0.0–0.1)
Basophils Relative: 0.7 % (ref 0.0–3.0)
Eosinophils Absolute: 0.2 K/uL (ref 0.0–0.7)
Eosinophils Relative: 3.4 % (ref 0.0–5.0)
HCT: 44.1 % (ref 39.0–52.0)
Hemoglobin: 14.5 g/dL (ref 13.0–17.0)
Lymphocytes Relative: 42.4 % (ref 12.0–46.0)
Lymphs Abs: 2.1 K/uL (ref 0.7–4.0)
MCHC: 32.9 g/dL (ref 30.0–36.0)
MCV: 96.9 fl (ref 78.0–100.0)
Monocytes Absolute: 0.4 K/uL (ref 0.1–1.0)
Monocytes Relative: 7.7 % (ref 3.0–12.0)
Neutro Abs: 2.2 K/uL (ref 1.4–7.7)
Neutrophils Relative %: 45.8 % (ref 43.0–77.0)
Platelets: 318 K/uL (ref 150.0–400.0)
RBC: 4.55 Mil/uL (ref 4.22–5.81)
RDW: 12.6 % (ref 11.5–15.5)
WBC: 4.9 K/uL (ref 4.0–10.5)

## 2024-03-23 LAB — LIPID PANEL
Cholesterol: 156 mg/dL (ref 0–200)
HDL: 35.8 mg/dL — ABNORMAL LOW (ref >39.00)
LDL Cholesterol: 107 mg/dL — ABNORMAL HIGH (ref 0–99)
NonHDL: 120.23
Total CHOL/HDL Ratio: 4
Triglycerides: 67 mg/dL (ref 0.0–149.0)
VLDL: 13.4 mg/dL (ref 0.0–40.0)

## 2024-03-23 LAB — HEMOGLOBIN A1C: Hgb A1c MFr Bld: 6 % (ref 4.6–6.5)

## 2024-03-23 NOTE — Progress Notes (Addendum)
 Established Patient Office Visit   Subjective:  Patient ID: Joseph Travis, male    DOB: 11/03/1980  Age: 43 y.o. MRN: [id number]  Chief Complaint  Patient presents with   Annual Exam    CPE. Pt is fasting.     HPI Encounter Diagnoses  Name Primary?   Healthcare maintenance Yes   Screening for cholesterol level    Screening for diabetes mellitus    Need for hepatitis B screening test    Screen for STD (sexually transmitted disease)    Immunization due    Doing okay.  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  No regular exercise outside of work.  Does have regular dental care.   Review of Systems  Constitutional: Negative.   HENT: Negative.    Eyes:  Negative for blurred vision, discharge and redness.  Respiratory: Negative.    Cardiovascular: Negative.   Gastrointestinal:  Negative for abdominal pain.  Genitourinary: Negative.   Musculoskeletal: Negative.  Negative for myalgias.  Skin:  Negative for rash.  Neurological:  Negative for tingling, loss of consciousness and weakness.  Endo/Heme/Allergies:  Negative for polydipsia.     Current Outpatient Medications:    ibuprofen  (ADVIL ) 800 MG tablet, Take 1 tablet (800 mg total) by mouth every 8 (eight) hours as needed. (Patient not taking: Reported on 03/23/2024), Disp: 21 tablet, Rfl: 0   meloxicam  (MOBIC ) 7.5 MG tablet, Take 1 tablet (7.5 mg total) by mouth daily., Disp: 30 tablet, Rfl: 0   methocarbamol  (ROBAXIN ) 500 MG tablet, Take 1 tablet (500 mg total) by mouth every 8 (eight) hours as needed for muscle spasms. (Patient not taking: Reported on 03/23/2024), Disp: 30 tablet, Rfl: 0   tamsulosin  (FLOMAX ) 0.4 MG CAPS capsule, Take 1 capsule (0.4 mg total) by mouth daily. (Patient not taking: Reported on 12/07/2023), Disp: 90 capsule, Rfl: 3   Objective:     BP 114/76 (BP Location: Left Arm, Patient Position: Sitting, Cuff Size: Large)   Pulse 76   Temp (!) 97.1 F (36.2 C) (Temporal)   Ht 5' 10 (1.778  m)   Wt 208 lb 6.4 oz (94.5 kg)   SpO2 98%   BMI 29.90 kg/m    Physical Exam Constitutional:      General: He is not in acute distress.    Appearance: Normal appearance. He is not ill-appearing, toxic-appearing or diaphoretic.  HENT:     Head: Normocephalic and atraumatic.     Right Ear: Tympanic membrane, ear canal and external ear normal.     Left Ear: Tympanic membrane, ear canal and external ear normal.     Mouth/Throat:     Mouth: Mucous membranes are moist.     Pharynx: Oropharynx is clear. No oropharyngeal exudate or posterior oropharyngeal erythema.  Eyes:     General: No scleral icterus.       Right eye: No discharge.        Left eye: No discharge.     Extraocular Movements: Extraocular movements intact.     Conjunctiva/sclera: Conjunctivae normal.     Pupils: Pupils are equal, round, and reactive to light.  Cardiovascular:     Rate and Rhythm: Normal rate and regular rhythm.  Pulmonary:     Effort: Pulmonary effort is normal. No respiratory distress.     Breath sounds: Normal breath sounds. No wheezing or rales.  Abdominal:     General: Bowel sounds are normal.     Tenderness: There is no abdominal  tenderness. There is no guarding.     Hernia: No hernia is present. There is no hernia in the left inguinal area or right inguinal area.  Genitourinary:    Penis: Circumcised. No hypospadias, erythema, tenderness, discharge, swelling or lesions.      Testes:        Right: Mass, tenderness or swelling not present. Right testis is descended.        Left: Mass, tenderness or swelling not present. Left testis is descended.     Epididymis:     Right: Not inflamed or enlarged.     Left: Not inflamed or enlarged.  Musculoskeletal:     Cervical back: No rigidity or tenderness.  Lymphadenopathy:     Lower Body: No right inguinal adenopathy. No left inguinal adenopathy.  Skin:    General: Skin is warm and dry.  Neurological:     Mental Status: He is alert and oriented to  person, place, and time.  Psychiatric:        Mood and Affect: Mood normal.        Behavior: Behavior normal.      Results for orders placed or performed in visit on 03/23/24  CBC with Differential/Platelet  Result Value Ref Range   WBC 4.9 4.0 - 10.5 K/uL   RBC 4.55 4.22 - 5.81 Mil/uL   Hemoglobin 14.5 13.0 - 17.0 g/dL   HCT 55.8 60.9 - 47.9 %   MCV 96.9 78.0 - 100.0 fl   MCHC 32.9 30.0 - 36.0 g/dL   RDW 87.3 88.4 - 84.4 %   Platelets 318.0 150.0 - 400.0 K/uL   Neutrophils Relative % 45.8 43.0 - 77.0 %   Lymphocytes Relative 42.4 12.0 - 46.0 %   Monocytes Relative 7.7 3.0 - 12.0 %   Eosinophils Relative 3.4 0.0 - 5.0 %   Basophils Relative 0.7 0.0 - 3.0 %   Neutro Abs 2.2 1.4 - 7.7 K/uL   Lymphs Abs 2.1 0.7 - 4.0 K/uL   Monocytes Absolute 0.4 0.1 - 1.0 K/uL   Eosinophils Absolute 0.2 0.0 - 0.7 K/uL   Basophils Absolute 0.0 0.0 - 0.1 K/uL  Comprehensive metabolic panel with GFR  Result Value Ref Range   Sodium 140 135 - 145 mEq/L   Potassium 4.3 3.5 - 5.1 mEq/L   Chloride 103 96 - 112 mEq/L   CO2 29 19 - 32 mEq/L   Glucose, Bld 83 70 - 99 mg/dL   BUN 9 6 - 23 mg/dL   Creatinine, Ser 8.77 0.40 - 1.50 mg/dL   Total Bilirubin 1.0 0.2 - 1.2 mg/dL   Alkaline Phosphatase 33 (L) 39 - 117 U/L   AST 22 0 - 37 U/L   ALT 15 0 - 53 U/L   Total Protein 8.0 6.0 - 8.3 g/dL   Albumin 4.5 3.5 - 5.2 g/dL   GFR 26.04 >39.99 mL/min   Calcium 9.4 8.4 - 10.5 mg/dL  Lipid panel  Result Value Ref Range   Cholesterol 156 0 - 200 mg/dL   Triglycerides 32.9 0.0 - 149.0 mg/dL   HDL 64.19 (L) >60.99 mg/dL   VLDL 86.5 0.0 - 59.9 mg/dL   LDL Cholesterol 892 (H) 0 - 99 mg/dL   Total CHOL/HDL Ratio 4    NonHDL 120.23   Urinalysis, Routine w reflex microscopic  Result Value Ref Range   Color, Urine YELLOW Yellow;Lt. Yellow;Straw;Dark Yellow;Amber;Green;Red;Brown   APPearance CLEAR Clear;Turbid;Slightly Cloudy;Cloudy   Specific Gravity, Urine 1.025 1.000 - 1.030  pH 6.0 5.0 - 8.0   Total  Protein, Urine NEGATIVE Negative   Urine Glucose NEGATIVE Negative   Ketones, ur NEGATIVE Negative   Bilirubin Urine NEGATIVE Negative   Hgb urine dipstick NEGATIVE Negative   Urobilinogen, UA 1.0 0.0 - 1.0   Leukocytes,Ua NEGATIVE Negative   Nitrite NEGATIVE Negative   WBC, UA 0-2/hpf 0-2/hpf   RBC / HPF none seen 0-2/hpf   Mucus, UA Presence of (A) None  Hemoglobin A1c  Result Value Ref Range   Hgb A1c MFr Bld 6.0 4.6 - 6.5 %  RPR  Result Value Ref Range   RPR Ser Ql NON-REACTIVE NON-REACTIVE  HIV Antibody (routine testing w rflx)  Result Value Ref Range   HIV FINAL INTERPRETATION     HIV 1&2 Ab, 4th Generation NON-REACTIVE NON-REACTIVE  Hepatitis B surface antibody,qualitative  Result Value Ref Range   Hep B S Ab NON-REACTIVE NON-REACTIVE      The 10-year ASCVD risk score (Arnett DK, et al., 2019) is: 2.6%    Assessment & Plan:   Healthcare maintenance -     CBC with Differential/Platelet -     Urinalysis, Routine w reflex microscopic  Screening for cholesterol level -     Comprehensive metabolic panel with GFR -     Lipid panel  Screening for diabetes mellitus -     Comprehensive metabolic panel with GFR -     Hemoglobin A1c  Need for hepatitis B screening test -     Hepatitis B surface antibody,qualitative  Screen for STD (sexually transmitted disease) -     RPR -     HIV Antibody (routine testing w rflx)  Immunization due -     Hepatitis A hepatitis B combined vaccine IM; Future    Return in about 1 year (around 03/23/2025), or if symptoms worsen or fail to improve, for annual physical.  Encouraged regular exercise for 30 minutes daily outside of work.  Checking routine labs, screening for STDs and screening for hep B immunity.  Information was given on health maintenance and disease prevention.  Elsie Sim Lent, MD

## 2024-03-24 LAB — HEPATITIS B SURFACE ANTIBODY,QUALITATIVE: Hep B S Ab: NONREACTIVE

## 2024-03-24 LAB — RPR: RPR Ser Ql: NONREACTIVE

## 2024-03-24 LAB — HIV ANTIBODY (ROUTINE TESTING W REFLEX): HIV 1&2 Ab, 4th Generation: NONREACTIVE

## 2024-03-27 NOTE — Addendum Note (Signed)
 Addended by: BERNETA ELSIE LABOR on: 03/27/2024 08:05 AM   Modules accepted: Orders
